# Patient Record
Sex: Female | Born: 1955 | Race: White | Hispanic: No | Marital: Single | Smoking: Heavy tobacco smoker
Health system: Southern US, Community
[De-identification: ages and names within clinical notes are randomized; demographics above are authoritative.]

## PROBLEM LIST (undated history)

## (undated) DIAGNOSIS — J449 Chronic obstructive pulmonary disease, unspecified: Secondary | ICD-10-CM

## (undated) DIAGNOSIS — I10 Essential (primary) hypertension: Secondary | ICD-10-CM

## (undated) DIAGNOSIS — D472 Monoclonal gammopathy: Secondary | ICD-10-CM

## (undated) DIAGNOSIS — J45909 Unspecified asthma, uncomplicated: Secondary | ICD-10-CM

## (undated) HISTORY — PX: OTHER SURGICAL HISTORY: SHX169

## (undated) HISTORY — DX: Chronic obstructive pulmonary disease, unspecified: J44.9

## (undated) HISTORY — PX: BONE MARROW BIOPSY: SHX199

## (undated) HISTORY — PX: TYMPANOSTOMY TUBE PLACEMENT: SHX32

## (undated) HISTORY — DX: Unspecified asthma, uncomplicated: J45.909

## (undated) HISTORY — PX: ADENOIDECTOMY: SUR15

## (undated) HISTORY — DX: Essential (primary) hypertension: I10

## (undated) HISTORY — PX: BREAST BIOPSY: SHX20

---

## 2002-12-02 ENCOUNTER — Other Ambulatory Visit: Admission: RE | Admit: 2002-12-02 | Discharge: 2002-12-02 | Payer: Self-pay | Admitting: Obstetrics and Gynecology

## 2006-02-24 ENCOUNTER — Encounter: Admission: RE | Admit: 2006-02-24 | Discharge: 2006-02-24 | Payer: Self-pay | Admitting: Internal Medicine

## 2006-10-19 ENCOUNTER — Ambulatory Visit: Payer: Self-pay | Admitting: Internal Medicine

## 2006-10-28 LAB — CBC WITH DIFFERENTIAL/PLATELET
Basophils Absolute: 0.1 10*3/uL (ref 0.0–0.1)
Eosinophils Absolute: 0.1 10*3/uL (ref 0.0–0.5)
HCT: 42.5 % (ref 34.8–46.6)
HGB: 14.3 g/dL (ref 11.6–15.9)
NEUT#: 4.2 10*3/uL (ref 1.5–6.5)
RDW: 13.9 % (ref 11.3–14.5)
lymph#: 1.5 10*3/uL (ref 0.9–3.3)

## 2006-10-28 LAB — ERYTHROCYTE SEDIMENTATION RATE: Sed Rate: 16 mm/hr (ref 0–30)

## 2006-11-02 LAB — COMPREHENSIVE METABOLIC PANEL
ALT: 24 U/L (ref 0–40)
AST: 18 U/L (ref 0–37)
Albumin: 5 g/dL (ref 3.5–5.2)
Alkaline Phosphatase: 64 U/L (ref 39–117)
BUN: 14 mg/dL (ref 6–23)
CO2: 28 mEq/L (ref 19–32)
Calcium: 9.9 mg/dL (ref 8.4–10.5)
Chloride: 101 mEq/L (ref 96–112)
Creatinine, Ser: 0.74 mg/dL (ref 0.40–1.20)
Glucose, Bld: 83 mg/dL (ref 70–99)
Potassium: 4.1 mEq/L (ref 3.5–5.3)
Sodium: 138 mEq/L (ref 135–145)
Total Bilirubin: 0.4 mg/dL (ref 0.3–1.2)
Total Protein: 7.6 g/dL (ref 6.0–8.3)

## 2006-11-02 LAB — IRON AND TIBC
%SAT: 20 % (ref 20–55)
Iron: 79 ug/dL (ref 42–145)
TIBC: 386 ug/dL (ref 250–470)
UIBC: 307 ug/dL

## 2006-11-02 LAB — LACTATE DEHYDROGENASE: LDH: 164 U/L (ref 94–250)

## 2006-11-02 LAB — KAPPA/LAMBDA LIGHT CHAINS: Kappa free light chain: 1.25 mg/dL (ref 0.33–1.94)

## 2006-11-02 LAB — IMMUNOFIXATION ELECTROPHORESIS
IgM, Serum: 225 mg/dL (ref 60–263)
Total Protein, Serum Electrophoresis: 7.6 g/dL (ref 6.0–8.3)

## 2006-11-02 LAB — BETA 2 MICROGLOBULIN, SERUM: Beta-2 Microglobulin: 1.17 mg/L (ref 1.01–1.73)

## 2006-11-03 ENCOUNTER — Ambulatory Visit (HOSPITAL_COMMUNITY): Admission: RE | Admit: 2006-11-03 | Discharge: 2006-11-03 | Payer: Self-pay | Admitting: Internal Medicine

## 2006-11-11 ENCOUNTER — Other Ambulatory Visit: Admission: RE | Admit: 2006-11-11 | Discharge: 2006-11-11 | Payer: Self-pay | Admitting: Obstetrics and Gynecology

## 2006-11-16 LAB — CBC WITH DIFFERENTIAL/PLATELET
Basophils Absolute: 0 10*3/uL (ref 0.0–0.1)
Eosinophils Absolute: 0.1 10*3/uL (ref 0.0–0.5)
HGB: 12.9 g/dL (ref 11.6–15.9)
LYMPH%: 17 % (ref 14.0–48.0)
MONO#: 0.3 10*3/uL (ref 0.1–0.9)
NEUT#: 5.2 10*3/uL (ref 1.5–6.5)
Platelets: 156 10*3/uL (ref 145–400)
RBC: 4.02 10*6/uL (ref 3.70–5.32)
WBC: 6.8 10*3/uL (ref 3.9–10.0)

## 2006-11-16 LAB — LACTATE DEHYDROGENASE: LDH: 145 U/L (ref 94–250)

## 2006-11-27 ENCOUNTER — Encounter (INDEPENDENT_AMBULATORY_CARE_PROVIDER_SITE_OTHER): Payer: Self-pay | Admitting: Specialist

## 2006-11-27 ENCOUNTER — Ambulatory Visit: Payer: Self-pay | Admitting: Internal Medicine

## 2006-11-27 ENCOUNTER — Ambulatory Visit (HOSPITAL_COMMUNITY): Admission: RE | Admit: 2006-11-27 | Discharge: 2006-11-27 | Payer: Self-pay | Admitting: Internal Medicine

## 2006-12-08 ENCOUNTER — Ambulatory Visit: Payer: Self-pay | Admitting: Internal Medicine

## 2006-12-10 LAB — CBC WITH DIFFERENTIAL/PLATELET
Basophils Absolute: 0 10*3/uL (ref 0.0–0.1)
EOS%: 1 % (ref 0.0–7.0)
Eosinophils Absolute: 0.1 10*3/uL (ref 0.0–0.5)
HCT: 38 % (ref 34.8–46.6)
HGB: 12.9 g/dL (ref 11.6–15.9)
LYMPH%: 19.9 % (ref 14.0–48.0)
MCH: 32.1 pg (ref 26.0–34.0)
MCV: 94.6 fL (ref 81.0–101.0)
MONO%: 4.8 % (ref 0.0–13.0)
NEUT#: 4.5 10*3/uL (ref 1.5–6.5)
NEUT%: 73.7 % (ref 39.6–76.8)
Platelets: 173 10*3/uL (ref 145–400)

## 2006-12-10 LAB — COMPREHENSIVE METABOLIC PANEL
AST: 15 U/L (ref 0–37)
Albumin: 4.6 g/dL (ref 3.5–5.2)
Alkaline Phosphatase: 57 U/L (ref 39–117)
BUN: 11 mg/dL (ref 6–23)
Creatinine, Ser: 0.78 mg/dL (ref 0.40–1.20)
Glucose, Bld: 126 mg/dL — ABNORMAL HIGH (ref 70–99)
Potassium: 3.9 mEq/L (ref 3.5–5.3)
Total Bilirubin: 0.3 mg/dL (ref 0.3–1.2)

## 2007-03-01 ENCOUNTER — Ambulatory Visit: Payer: Self-pay | Admitting: Internal Medicine

## 2007-03-04 LAB — CBC WITH DIFFERENTIAL/PLATELET
BASO%: 0.6 % (ref 0.0–2.0)
HCT: 40 % (ref 34.8–46.6)
LYMPH%: 19.9 % (ref 14.0–48.0)
MCHC: 34.4 g/dL (ref 32.0–36.0)
MCV: 93.3 fL (ref 81.0–101.0)
MONO#: 0.5 10*3/uL (ref 0.1–0.9)
MONO%: 6.1 % (ref 0.0–13.0)
NEUT%: 71.9 % (ref 39.6–76.8)
Platelets: 238 10*3/uL (ref 145–400)
RBC: 4.29 10*6/uL (ref 3.70–5.32)
WBC: 8 10*3/uL (ref 3.9–10.0)

## 2007-03-05 LAB — COMPREHENSIVE METABOLIC PANEL
ALT: 13 U/L (ref 0–35)
Alkaline Phosphatase: 59 U/L (ref 39–117)
CO2: 24 mEq/L (ref 19–32)
Creatinine, Ser: 0.75 mg/dL (ref 0.40–1.20)
Glucose, Bld: 92 mg/dL (ref 70–99)
Total Bilirubin: 0.3 mg/dL (ref 0.3–1.2)

## 2007-03-05 LAB — LACTATE DEHYDROGENASE: LDH: 175 U/L (ref 94–250)

## 2007-03-05 LAB — IGG, IGA, IGM
IgG (Immunoglobin G), Serum: 899 mg/dL (ref 694–1618)
IgM, Serum: 213 mg/dL (ref 60–263)

## 2007-03-11 ENCOUNTER — Ambulatory Visit (HOSPITAL_COMMUNITY): Admission: RE | Admit: 2007-03-11 | Discharge: 2007-03-11 | Payer: Self-pay | Admitting: Internal Medicine

## 2007-06-01 ENCOUNTER — Ambulatory Visit: Payer: Self-pay | Admitting: Internal Medicine

## 2007-06-03 LAB — CBC WITH DIFFERENTIAL/PLATELET
BASO%: 1.5 % (ref 0.0–2.0)
EOS%: 0 % (ref 0.0–7.0)
Eosinophils Absolute: 0 10*3/uL (ref 0.0–0.5)
LYMPH%: 23.4 % (ref 14.0–48.0)
MCHC: 34.9 g/dL (ref 32.0–36.0)
MCV: 92.8 fL (ref 81.0–101.0)
MONO%: 9 % (ref 0.0–13.0)
NEUT#: 3.3 10*3/uL (ref 1.5–6.5)
Platelets: 175 10*3/uL (ref 145–400)
RBC: 4.12 10*6/uL (ref 3.70–5.32)
RDW: 13.9 % (ref 11.3–14.5)

## 2007-06-04 LAB — COMPREHENSIVE METABOLIC PANEL
ALT: 30 U/L (ref 0–35)
AST: 26 U/L (ref 0–37)
Albumin: 4.4 g/dL (ref 3.5–5.2)
Alkaline Phosphatase: 100 U/L (ref 39–117)
Glucose, Bld: 92 mg/dL (ref 70–99)
Potassium: 4 mEq/L (ref 3.5–5.3)
Sodium: 142 mEq/L (ref 135–145)
Total Bilirubin: 0.4 mg/dL (ref 0.3–1.2)
Total Protein: 7.1 g/dL (ref 6.0–8.3)

## 2007-06-04 LAB — BETA 2 MICROGLOBULIN, SERUM: Beta-2 Microglobulin: 1.64 mg/L (ref 1.01–1.73)

## 2007-06-04 LAB — IGG, IGA, IGM: IgG (Immunoglobin G), Serum: 713 mg/dL (ref 694–1618)

## 2007-10-01 ENCOUNTER — Ambulatory Visit: Payer: Self-pay | Admitting: Internal Medicine

## 2007-10-05 LAB — CBC WITH DIFFERENTIAL/PLATELET
Basophils Absolute: 0 10*3/uL (ref 0.0–0.1)
EOS%: 2.1 % (ref 0.0–7.0)
HGB: 12.1 g/dL (ref 11.6–15.9)
LYMPH%: 22.8 % (ref 14.0–48.0)
MCH: 32.2 pg (ref 26.0–34.0)
MCV: 93.8 fL (ref 81.0–101.0)
MONO%: 5.8 % (ref 0.0–13.0)
Platelets: 145 10*3/uL (ref 145–400)
RDW: 13.1 % (ref 11.3–14.5)

## 2007-10-06 LAB — COMPREHENSIVE METABOLIC PANEL
AST: 16 U/L (ref 0–37)
Albumin: 4.3 g/dL (ref 3.5–5.2)
Alkaline Phosphatase: 48 U/L (ref 39–117)
BUN: 17 mg/dL (ref 6–23)
Creatinine, Ser: 0.82 mg/dL (ref 0.40–1.20)
Potassium: 4.4 mEq/L (ref 3.5–5.3)
Total Bilirubin: 0.3 mg/dL (ref 0.3–1.2)

## 2007-10-06 LAB — KAPPA/LAMBDA LIGHT CHAINS
Kappa free light chain: 1.02 mg/dL (ref 0.33–1.94)
Lambda Free Lght Chn: 0.99 mg/dL (ref 0.57–2.63)

## 2007-10-06 LAB — IGG, IGA, IGM
IgA: 177 mg/dL (ref 68–378)
IgG (Immunoglobin G), Serum: 786 mg/dL (ref 694–1618)
IgM, Serum: 210 mg/dL (ref 60–263)

## 2008-03-31 ENCOUNTER — Ambulatory Visit: Payer: Self-pay | Admitting: Internal Medicine

## 2008-04-04 LAB — CBC WITH DIFFERENTIAL/PLATELET
Basophils Absolute: 0 10*3/uL (ref 0.0–0.1)
EOS%: 0 % (ref 0.0–7.0)
Eosinophils Absolute: 0 10*3/uL (ref 0.0–0.5)
HCT: 40.8 % (ref 34.8–46.6)
HGB: 14.1 g/dL (ref 11.6–15.9)
MCH: 32.1 pg (ref 26.0–34.0)
MCV: 93.2 fL (ref 81.0–101.0)
MONO%: 4.1 % (ref 0.0–13.0)
NEUT#: 4.7 10*3/uL (ref 1.5–6.5)
NEUT%: 76 % (ref 39.6–76.8)
lymph#: 1.2 10*3/uL (ref 0.9–3.3)

## 2008-04-05 LAB — COMPREHENSIVE METABOLIC PANEL
AST: 14 U/L (ref 0–37)
Albumin: 4.5 g/dL (ref 3.5–5.2)
BUN: 15 mg/dL (ref 6–23)
Calcium: 9.5 mg/dL (ref 8.4–10.5)
Chloride: 104 mEq/L (ref 96–112)
Creatinine, Ser: 0.79 mg/dL (ref 0.40–1.20)
Glucose, Bld: 91 mg/dL (ref 70–99)
Potassium: 4.3 mEq/L (ref 3.5–5.3)

## 2008-04-05 LAB — BETA 2 MICROGLOBULIN, SERUM: Beta-2 Microglobulin: 1.3 mg/L (ref 1.01–1.73)

## 2008-04-05 LAB — IGG, IGA, IGM
IgA: 177 mg/dL (ref 68–378)
IgG (Immunoglobin G), Serum: 860 mg/dL (ref 694–1618)

## 2008-04-05 LAB — KAPPA/LAMBDA LIGHT CHAINS: Lambda Free Lght Chn: 1.28 mg/dL (ref 0.57–2.63)

## 2008-10-06 ENCOUNTER — Ambulatory Visit: Payer: Self-pay | Admitting: Internal Medicine

## 2009-10-08 ENCOUNTER — Encounter: Admission: RE | Admit: 2009-10-08 | Discharge: 2009-10-08 | Payer: Self-pay | Admitting: Internal Medicine

## 2013-04-07 ENCOUNTER — Emergency Department (INDEPENDENT_AMBULATORY_CARE_PROVIDER_SITE_OTHER)
Admission: EM | Admit: 2013-04-07 | Discharge: 2013-04-07 | Disposition: A | Discharge status: Home / Self Care | Payer: No Typology Code available for payment source | Source: Home / Self Care | Attending: Family Medicine | Admitting: Family Medicine

## 2013-04-07 ENCOUNTER — Encounter (HOSPITAL_COMMUNITY): Payer: Self-pay

## 2013-04-07 DIAGNOSIS — H00019 Hordeolum externum unspecified eye, unspecified eyelid: Secondary | ICD-10-CM

## 2013-04-07 DIAGNOSIS — H00016 Hordeolum externum left eye, unspecified eyelid: Secondary | ICD-10-CM

## 2013-04-07 DIAGNOSIS — K029 Dental caries, unspecified: Secondary | ICD-10-CM | POA: Diagnosis present

## 2013-04-07 DIAGNOSIS — D472 Monoclonal gammopathy: Secondary | ICD-10-CM

## 2013-04-07 HISTORY — DX: Monoclonal gammopathy: D47.2

## 2013-04-07 MED ORDER — DOXYCYCLINE HYCLATE 100 MG PO TABS: 100.0000 mg | ORAL_TABLET | Freq: Two times a day (BID) | ORAL | Status: DC

## 2013-04-07 NOTE — ED Provider Notes (Signed)
History     CSN: [id number]  Arrival date & time 04/07/13  1533   First MD Initiated Contact with Patient 04/07/13 1605      Chief Complaint  Patient presents with  . Dental Problem    HPI Pt says that she has had a dental problem for several weeks and not able to get to see a dentist because of not having medical insurance.  No fever or chills.  She has a stye on left eye.    Past Medical History  Diagnosis Date  . MGUS (monoclonal gammopathy of unknown significance)     History reviewed. No pertinent past surgical history.  No family history on file.  History  Substance Use Topics  . Smoking status: Light Tobacco Smoker  . Smokeless tobacco: Not on file  . Alcohol Use: Yes    OB History   Grav Para Term Preterm Abortions TAB SAB Ect Mult Living                 Review of Systems Constitutional: Negative.  HENT: dental problems.  Respiratory: Negative.  Cardiovascular: Negative.  Gastrointestinal: Negative.  Endocrine: Negative.  Genitourinary: Negative.  Musculoskeletal: Negative.  Skin: Negative.  Allergic/Immunologic: Negative.  Neurological: Negative.  Hematological: Negative.  Psychiatric/Behavioral: Negative.  All other systems reviewed and are negative   Allergies  Codeine  Home Medications   Current Outpatient Rx  Name  Route  Sig  Dispense  Refill  . loratadine (CLARITIN) 10 MG tablet   Oral   Take 10 mg by mouth daily.           BP 109/41  Pulse 95  Temp(Src) 98.1 F (36.7 C) (Oral)  Resp 18  SpO2 98%  Physical Exam Nursing note and vitals reviewed.  Constitutional: She is oriented to person, place, and time. She appears well-developed and well-nourished. No distress.  HENT: severe dental caries and swollen gums  Head: Normocephalic and atraumatic.  Eyes: small stye left eye.  Conjunctivae and EOM are normal. Pupils are equal, round, and reactive to light.  Neck: Normal range of motion. Neck supple. No JVD present. No  tracheal deviation present. No thyromegaly present.  Cardiovascular: Normal rate, regular rhythm and normal heart sounds.  Pulmonary/Chest: Effort normal and breath sounds normal. No respiratory distress. She has no wheezes.  Abdominal: Soft. Bowel sounds are normal.  Musculoskeletal: Normal range of motion. She exhibits no edema and no tenderness.  Lymphadenopathy:  She has no cervical adenopathy.  Neurological: She is alert and oriented to person, place, and time. She has normal reflexes.  Skin: Skin is warm and dry.  Psychiatric: She has a normal mood and affect. Her behavior is normal. Judgment and thought content normal.   ED Course  Procedures (including critical care time)  Labs Reviewed - No data to display No results found.   No diagnosis found.  MDM  IMPRESSION  Dental Caries   MGUS  Stye left eye   RECOMMENDATIONS / PLAN Refer to dentist for further evaluation Doxycycline 100 mg po bid  FOLLOW UP 4 months   The patient was given clear instructions to go to ER or return to medical center if symptoms don't improve, worsen or new problems develop.  The patient verbalized understanding.  The patient was told to call to get lab results if they haven't heard anything in the next week.            Cleora Fleet, MD 04/07/13 [phone]

## 2013-04-07 NOTE — ED Notes (Signed)
Patient states needs a referral to dentist Also has a sty to top of left eye Needs allergy medication refill

## 2013-04-26 ENCOUNTER — Encounter (HOSPITAL_COMMUNITY): Payer: Self-pay | Admitting: Nurse Practitioner

## 2013-04-26 ENCOUNTER — Emergency Department (HOSPITAL_COMMUNITY)
Admission: EM | Admit: 2013-04-26 | Discharge: 2013-04-26 | Disposition: A | Discharge status: Nursing Home (Includes ICF) | Payer: No Typology Code available for payment source | Source: Home / Self Care

## 2013-04-26 ENCOUNTER — Encounter (HOSPITAL_COMMUNITY): Payer: Self-pay

## 2013-04-26 ENCOUNTER — Emergency Department (HOSPITAL_COMMUNITY)
Admission: EM | Admit: 2013-04-26 | Discharge: 2013-04-26 | Disposition: A | Discharge status: Home / Self Care | Payer: No Typology Code available for payment source | Attending: Emergency Medicine | Admitting: Emergency Medicine

## 2013-04-26 DIAGNOSIS — K029 Dental caries, unspecified: Secondary | ICD-10-CM | POA: Insufficient documentation

## 2013-04-26 DIAGNOSIS — R220 Localized swelling, mass and lump, head: Secondary | ICD-10-CM | POA: Insufficient documentation

## 2013-04-26 DIAGNOSIS — K047 Periapical abscess without sinus: Secondary | ICD-10-CM | POA: Insufficient documentation

## 2013-04-26 DIAGNOSIS — F17200 Nicotine dependence, unspecified, uncomplicated: Secondary | ICD-10-CM | POA: Insufficient documentation

## 2013-04-26 DIAGNOSIS — Z8742 Personal history of other diseases of the female genital tract: Secondary | ICD-10-CM | POA: Insufficient documentation

## 2013-04-26 MED ORDER — AMOXICILLIN 500 MG PO CAPS: 1000.0000 mg | ORAL_CAPSULE | Freq: Two times a day (BID) | ORAL | Status: DC

## 2013-04-26 MED ORDER — HYDROCODONE-ACETAMINOPHEN 5-325 MG PO TABS: 1.0000 | ORAL_TABLET | ORAL | Status: DC | PRN

## 2013-04-26 MED ORDER — AMOXICILLIN 500 MG PO CAPS
1000.0000 mg | ORAL_CAPSULE | Freq: Once | ORAL | Status: AC
  Administered 2013-04-26: 1000 mg via ORAL
  Filled 2013-04-26: qty 2

## 2013-04-26 NOTE — ED Provider Notes (Signed)
Medical screening examination/treatment/procedure(s) were performed by non-physician practitioner and as supervising physician I was immediately available for consultation/collaboration.   Dione Booze, MD 04/26/13 2153

## 2013-04-26 NOTE — ED Provider Notes (Signed)
History    This chart was scribed for non-physician practitioner Elpidio Anis, PA-C working with Dione Booze, MD by Gerlean Ren, ED Scribe. This patient was seen in room TR10C/TR10C and the patient's care was started at 7:15 PM.   CSN: [id number]  Arrival date & time 04/26/13  1806   First MD Initiated Contact with Patient 04/26/13 1839      Chief Complaint  Patient presents with  . Dental Pain     The history is provided by the patient. No language interpreter was used.  Debbie Black is a 57 y.o. female who presents to the Emergency Department complaining of right side dental pain with associated swelling that began today and is gradually worsening.  Pt has had similar pain and swelling on the right side about one month ago for which pt took doxycycline from Urgent Care with improvements to symptoms.  Pt was told that dentist would call her but was never contacted by them.   Pt reports dental pain has been previously controlled by tylenol and ibuprofen but requests something stronger because current pain is more severe.  Pt denies associated fever.     Past Medical History  Diagnosis Date  . MGUS (monoclonal gammopathy of unknown significance)     History reviewed. No pertinent past surgical history.  History reviewed. No pertinent family history.  History  Substance Use Topics  . Smoking status: Light Tobacco Smoker  . Smokeless tobacco: Not on file  . Alcohol Use: Yes    No OB history provided.   Review of Systems  Constitutional: Negative for fever.  HENT: Positive for facial swelling and dental problem.     Allergies  Adhesive; Codeine; and Latex  Home Medications   Current Outpatient Rx  Name  Route  Sig  Dispense  Refill  . Ibuprofen (IBU PO)   Oral   Take 3 tablets by mouth once.           BP 134/53  Pulse 92  Temp(Src) 98.9 F (37.2 C) (Oral)  Resp 16  SpO2 97%  Physical Exam  Nursing note and vitals reviewed. Constitutional: She is oriented  to person, place, and time. She appears well-developed and well-nourished. No distress.  HENT:  Head: Normocephalic and atraumatic.  Generally poor dentition with widespread decay and multiple caries. Right sided facial swelling over mid-mandible.   Eyes: EOM are normal.  Neck: Neck supple. No tracheal deviation present.  Cardiovascular: Normal rate.   Pulmonary/Chest: Effort normal. No respiratory distress.  Musculoskeletal: Normal range of motion.  Neurological: She is alert and oriented to person, place, and time.  Skin: Skin is warm and dry.  Psychiatric: She has a normal mood and affect. Her behavior is normal.    ED Course  Procedures (including critical care time) DIAGNOSTIC STUDIES: Oxygen Saturation is 97% on room air, adequate by my interpretation.    COORDINATION OF CARE: 7:18 PM- Discussed pain medicine and antibiotics.  Advised pt to use warm compresses.  Informed pt that further follow-up with dentist will be necessary and that the waitlist at the dentist previously mentioned may be long.  Pt verbalizes understanding and agrees with plan.     No diagnosis found.   1. Dental abscess. MDM  Uncomplicated dental abscess      I personally performed the services described in this documentation, which was scribed in my presence. The recorded information has been reviewed and is accurate.     Arnoldo Hooker, PA-C 04/26/13 1925

## 2013-04-26 NOTE — ED Notes (Signed)
[facility] transfer for R sided toothache/swelling, possible infection. Pt is awaiting appt at dental clinic for same but pain was worse today

## 2013-04-26 NOTE — ED Notes (Signed)
Patient states has toothache to bottom right side Face is swollen and hot to the touch Referral to [facility] faxed out again to get an appt ASAP

## 2013-04-27 NOTE — ED Notes (Signed)
Patient was seen in the ED for a dental abscess Was referred to Dr drewry m vincent [address] [phone]

## 2014-09-29 ENCOUNTER — Other Ambulatory Visit (HOSPITAL_COMMUNITY): Payer: Self-pay | Admitting: Endocrinology

## 2014-09-29 DIAGNOSIS — E0590 Thyrotoxicosis, unspecified without thyrotoxic crisis or storm: Secondary | ICD-10-CM

## 2014-10-09 ENCOUNTER — Encounter (HOSPITAL_COMMUNITY): Payer: Self-pay

## 2014-10-10 ENCOUNTER — Encounter (HOSPITAL_COMMUNITY): Payer: No Typology Code available for payment source

## 2014-10-11 ENCOUNTER — Encounter (HOSPITAL_COMMUNITY)
Admission: RE | Admit: 2014-10-11 | Discharge: 2014-10-11 | Disposition: A | Discharge status: Home / Self Care | Payer: No Typology Code available for payment source | Source: Ambulatory Visit | Attending: Endocrinology | Admitting: Endocrinology

## 2014-10-11 DIAGNOSIS — E0590 Thyrotoxicosis, unspecified without thyrotoxic crisis or storm: Secondary | ICD-10-CM | POA: Insufficient documentation

## 2014-10-12 ENCOUNTER — Encounter (HOSPITAL_COMMUNITY)
Admission: RE | Admit: 2014-10-12 | Discharge: 2014-10-12 | Disposition: A | Discharge status: Home / Self Care | Payer: No Typology Code available for payment source | Source: Ambulatory Visit | Attending: Endocrinology | Admitting: Endocrinology

## 2014-10-12 DIAGNOSIS — E0590 Thyrotoxicosis, unspecified without thyrotoxic crisis or storm: Secondary | ICD-10-CM | POA: Diagnosis not present

## 2014-10-12 MED ORDER — SODIUM IODIDE I 131 CAPSULE
12.0000 | Freq: Once | INTRAVENOUS | Status: AC | PRN
  Administered 2014-10-12: 12 via ORAL

## 2014-10-12 MED ORDER — SODIUM PERTECHNETATE TC 99M INJECTION
11.0000 | Freq: Once | INTRAVENOUS | Status: AC | PRN
  Administered 2014-10-12: 11 via INTRAVENOUS

## 2014-11-01 ENCOUNTER — Other Ambulatory Visit (HOSPITAL_COMMUNITY): Payer: Self-pay | Admitting: Endocrinology

## 2014-11-01 DIAGNOSIS — E0590 Thyrotoxicosis, unspecified without thyrotoxic crisis or storm: Secondary | ICD-10-CM

## 2014-11-15 ENCOUNTER — Encounter (HOSPITAL_COMMUNITY)
Admission: RE | Admit: 2014-11-15 | Discharge: 2014-11-15 | Disposition: A | Discharge status: Home / Self Care | Payer: Self-pay | Source: Ambulatory Visit | Attending: Endocrinology | Admitting: Endocrinology

## 2015-11-03 ENCOUNTER — Other Ambulatory Visit: Payer: Self-pay | Admitting: Internal Medicine

## 2015-11-03 DIAGNOSIS — Z1231 Encounter for screening mammogram for malignant neoplasm of breast: Secondary | ICD-10-CM

## 2015-12-18 ENCOUNTER — Ambulatory Visit
Admission: RE | Admit: 2015-12-18 | Discharge: 2015-12-18 | Disposition: A | Discharge status: Home / Self Care | Payer: No Typology Code available for payment source | Source: Ambulatory Visit | Attending: Internal Medicine | Admitting: Internal Medicine

## 2015-12-18 DIAGNOSIS — Z1231 Encounter for screening mammogram for malignant neoplasm of breast: Secondary | ICD-10-CM

## 2015-12-21 ENCOUNTER — Encounter: Payer: Self-pay | Admitting: Internal Medicine

## 2015-12-21 ENCOUNTER — Other Ambulatory Visit: Payer: Self-pay | Admitting: Internal Medicine

## 2015-12-21 DIAGNOSIS — R928 Other abnormal and inconclusive findings on diagnostic imaging of breast: Secondary | ICD-10-CM

## 2015-12-27 ENCOUNTER — Other Ambulatory Visit: Payer: Self-pay | Admitting: Internal Medicine

## 2015-12-27 ENCOUNTER — Ambulatory Visit
Admission: RE | Admit: 2015-12-27 | Discharge: 2015-12-27 | Disposition: A | Discharge status: Home / Self Care | Payer: No Typology Code available for payment source | Source: Ambulatory Visit | Attending: Internal Medicine | Admitting: Internal Medicine

## 2015-12-27 DIAGNOSIS — R928 Other abnormal and inconclusive findings on diagnostic imaging of breast: Secondary | ICD-10-CM

## 2016-01-02 ENCOUNTER — Other Ambulatory Visit: Payer: Self-pay | Admitting: Internal Medicine

## 2016-01-02 ENCOUNTER — Ambulatory Visit
Admission: RE | Admit: 2016-01-02 | Discharge: 2016-01-02 | Disposition: A | Discharge status: Home / Self Care | Payer: No Typology Code available for payment source | Source: Ambulatory Visit | Attending: Internal Medicine | Admitting: Internal Medicine

## 2016-01-02 DIAGNOSIS — R928 Other abnormal and inconclusive findings on diagnostic imaging of breast: Secondary | ICD-10-CM

## 2016-01-02 HISTORY — PX: BREAST BIOPSY: SHX20

## 2019-10-07 ENCOUNTER — Ambulatory Visit (INDEPENDENT_AMBULATORY_CARE_PROVIDER_SITE_OTHER): Payer: No Typology Code available for payment source

## 2019-10-07 ENCOUNTER — Other Ambulatory Visit: Payer: Self-pay

## 2019-10-07 ENCOUNTER — Encounter (HOSPITAL_COMMUNITY): Payer: Self-pay | Admitting: Emergency Medicine

## 2019-10-07 ENCOUNTER — Ambulatory Visit (HOSPITAL_COMMUNITY)
Admission: EM | Admit: 2019-10-07 | Discharge: 2019-10-07 | Disposition: A | Discharge status: Home / Self Care | Payer: No Typology Code available for payment source | Attending: Emergency Medicine | Admitting: Emergency Medicine

## 2019-10-07 DIAGNOSIS — M25551 Pain in right hip: Secondary | ICD-10-CM

## 2019-10-07 DIAGNOSIS — S42292A Other displaced fracture of upper end of left humerus, initial encounter for closed fracture: Secondary | ICD-10-CM

## 2019-10-07 MED ORDER — IBUPROFEN 800 MG PO TABS
800.0000 mg | ORAL_TABLET | Freq: Once | ORAL | Status: AC
  Administered 2019-10-07: 20:00:00 800 mg via ORAL

## 2019-10-07 MED ORDER — FLUTICASONE PROPIONATE 50 MCG/ACT NA SUSP: 1.0000 | Freq: Every day | NASAL | 0 refills | Status: DC

## 2019-10-07 MED ORDER — IBUPROFEN 800 MG PO TABS
ORAL_TABLET | ORAL | Status: AC
  Filled 2019-10-07: qty 1

## 2019-10-07 MED ORDER — TRAMADOL HCL 50 MG PO TABS: 50.0000 mg | ORAL_TABLET | Freq: Four times a day (QID) | ORAL | 0 refills | Status: DC | PRN

## 2019-10-07 NOTE — ED Provider Notes (Signed)
[place] [facility]    CSN: [id number] Arrival date & time: 10/07/19  1722      History   Chief Complaint Chief Complaint  Patient presents with  . Fall  . Shoulder Pain    HPI Debbie Black is a 63 y.o. female history of osteoporosis, presenting today for evaluation of left shoulder pain and right hip pain after a fall.  Patient was walking yesterday, tripped and fell forward landing on her right side and then rolled onto her left causing her left shoulder pain.  Since she has had decreased ability to move her left shoulder as well as bruising that is extending down the upper arm.  Pain at arm 8 out of 10.  Denies previous injury to this shoulder.  She also endorses some right hip pain, points to left lower back area.  Rates pain at hips 6 out of 10.  Denies issues with control of urination or bowel movements.  Denies use of blood thinners.  Denies hitting head or loss of consciousness.  Denies dizziness, lightheadedness or vision changes.  Denies difficulty speaking.  HPI  Past Medical History:  Diagnosis Date  . MGUS (monoclonal gammopathy of unknown significance)     Patient Active Problem List   Diagnosis Date Noted  . MGUS (monoclonal gammopathy of unknown significance) 04/07/2013  . Dental caries 04/07/2013  . Stye 04/07/2013    History reviewed. No pertinent surgical history.  OB History   No obstetric history on file.      Home Medications    Prior to Admission medications   Medication Sig Start Date End Date Taking? Authorizing Provider  fluticasone (FLONASE) 50 MCG/ACT nasal spray Place 1-2 sprays into both nostrils daily for 7 days. 10/07/19 10/14/19  Gwyn Mehring C, PA-C  Ibuprofen (IBU PO) Take 3 tablets by mouth once.    [provider]  traMADol (ULTRAM) 50 MG tablet Take 1 tablet (50 mg total) by mouth every 6 (six) hours as needed for severe pain. 10/07/19   Beila Purdie, Elesa Hacker, PA-C    Family History No family history on file.   Social History Social History   Tobacco Use  . Smoking status: Light Tobacco Smoker  Substance Use Topics  . Alcohol use: Yes  . Drug use: Not on file     Allergies   Adhesive [tape], Codeine, and Latex   Review of Systems Review of Systems  Constitutional: Negative for activity change, chills, diaphoresis and fatigue.  HENT: Negative for ear pain, tinnitus and trouble swallowing.   Eyes: Negative for photophobia and visual disturbance.  Respiratory: Negative for cough, chest tightness and shortness of breath.   Cardiovascular: Negative for chest pain and leg swelling.  Gastrointestinal: Negative for abdominal pain, blood in stool, nausea and vomiting.  Musculoskeletal: Positive for arthralgias, back pain and myalgias. Negative for gait problem, neck pain and neck stiffness.  Skin: Positive for color change. Negative for wound.  Neurological: Negative for dizziness, weakness, light-headedness, numbness and headaches.     Physical Exam Triage Vital Signs ED Triage Vitals  Enc Vitals Group     BP 10/07/19 1735 (!) 168/86     Pulse Rate 10/07/19 1735 (!) 104     Resp 10/07/19 1735 16     Temp 10/07/19 1735 98.1 F (36.7 C)     Temp Source 10/07/19 1735 Temporal     SpO2 10/07/19 1735 99 %     Weight --      Height --  Head Circumference --      Peak Flow --      Pain Score 10/07/19 1743 9     Pain Loc --      Pain Edu? --      Excl. in [place]? --    No data found.  Updated Vital Signs BP (!) 168/86 (BP Location: Left Arm)   Pulse (!) 104   Temp 98.1 F (36.7 C) (Temporal)   Resp 16   SpO2 99%   Visual Acuity Right Eye Distance:   Left Eye Distance:   Bilateral Distance:    Right Eye Near:   Left Eye Near:    Bilateral Near:     Physical Exam Vitals signs and nursing note reviewed.  Constitutional:      General: She is not in acute distress.    Appearance: She is well-developed.  HENT:     Head: Normocephalic and atraumatic.  Eyes:      Extraocular Movements: Extraocular movements intact.     Conjunctiva/sclera: Conjunctivae normal.     Pupils: Pupils are equal, round, and reactive to light.  Neck:     Musculoskeletal: Neck supple.  Cardiovascular:     Rate and Rhythm: Normal rate and regular rhythm.     Heart sounds: No murmur.  Pulmonary:     Effort: Pulmonary effort is normal. No respiratory distress.     Breath sounds: Normal breath sounds.  Abdominal:     Palpations: Abdomen is soft.     Tenderness: There is no abdominal tenderness.  Musculoskeletal:     Comments: Very limited range of motion of left shoulder, tender to palpation of distal clavicle over AC joint and extending into proximal arm, bruising noted diffusely over bicep area  Nontender over scapular spine  Nontender to palpation of cervical, thoracic and lumbar spine midline, tenderness over right lateral lower lumbar/sacral area, nontender over anterior bony prominences of hip and to right groin  Gait with minimal abnormality  Skin:    General: Skin is warm and dry.  Neurological:     General: No focal deficit present.     Mental Status: She is alert and oriented to person, place, and time.     Comments: Speech clear, face symmetric      UC Treatments / Results  Labs (all labs ordered are listed, but only abnormal results are displayed) Labs Reviewed - No data to display  EKG   Radiology Dg Pelvis 1-2 Views  Result Date: 10/07/2019 CLINICAL DATA:  Right low back and hip pain after fall yesterday. EXAM: PELVIS - 1-2 VIEW COMPARISON:  None. FINDINGS: The patient is rotated, limiting evaluation. There is no evidence of pelvic fracture or diastasis. No pelvic bone lesions are seen. The hip joint spaces are preserved. Soft tissues are unremarkable. IMPRESSION: 1. Limited study.  No acute osseous abnormality. Electronically Signed   By: Titus Dubin M.D.   On: 10/07/2019 18:41   Dg Shoulder Left  Result Date: 10/07/2019 CLINICAL DATA:   Left shoulder pain post fall. EXAM: LEFT SHOULDER - 2+ VIEW COMPARISON:  None. FINDINGS: There is an impacted comminuted fracture of the left humeral head and neck with associated inferior displacement of the humerus on the glenoid. Overlying soft tissue swelling. IMPRESSION: 1. Impacted comminuted fracture of the left humeral head and neck with associated inferior displacement of the humerus on the glenoid. 2. Overlying soft tissue swelling. Electronically Signed   By: Fidela Salisbury M.D.   On: 10/07/2019 18:39  Dg Hip Unilat With Pelvis 2-3 Views Left  Result Date: 10/07/2019 CLINICAL DATA:  Hip pain EXAM: DG HIP (WITH OR WITHOUT PELVIS) 2-3V LEFT COMPARISON:  None. FINDINGS: There is no evidence of hip fracture or dislocation. There is no evidence of arthropathy or other focal bone abnormality. IMPRESSION: Negative. Electronically Signed   By: Constance Holster M.D.   On: 10/07/2019 19:11   Dg Hip Unilat With Pelvis 2-3 Views Right  Result Date: 10/07/2019 CLINICAL DATA:  Hip pain EXAM: DG HIP (WITH OR WITHOUT PELVIS) 2-3V RIGHT COMPARISON:  None. FINDINGS: There is no evidence of hip fracture or dislocation. There is no evidence of arthropathy or other focal bone abnormality. IMPRESSION: Negative. Electronically Signed   By: Constance Holster M.D.   On: 10/07/2019 19:12    Procedures Procedures (including critical care time)  Medications Ordered in UC Medications  ibuprofen (ADVIL) tablet 800 mg (800 mg Oral Given 10/07/19 1951)  ibuprofen (ADVIL) 800 MG tablet (has no administration in time range)    Initial Impression / Assessment and Plan / UC Course  I have reviewed the triage vital signs and the nursing notes.  Pertinent labs & imaging results that were available during my care of the patient were reviewed by me and considered in my medical decision making (see chart for details).    Left shoulder with comminuted/displaced fracture of humerus.  Discussed with Dr. Doreatha Martin.   Shoulder immobilizer placed, will follow up with him in clinic next week.  Tylenol and ibuprofen for pain in the interim, provided tramadol for severe pain.  Patient states she cannot take hydrocodone.  Feel benefit greater than risk, registry checked.  Initial x-ray of pelvis asymmetric, film with rotation, suboptimal for evaluation.  Repeated bilateral hips in order to ensure no bony abnormality.  Repeat films clear, no acute bony abnormality  Discussed strict return precautions. Patient verbalized understanding and is agreeable with plan.   Final Clinical Impressions(s) / UC Diagnoses   Final diagnoses:  Right hip pain  Closed fracture of head of left humerus, initial encounter     Discharge Instructions     Please follow-up with Dr. Tama Headings office, call first thing Monday morning Please wear shoulder sling 24/7 including while sleeping and bathing  May use tramadol for severe pain, if having any adverse side effects please stop May use Tylenol 500 to 1000 mg every 4-6 hours Ibuprofen 400 mg every 8 hours    ED Prescriptions    Medication Sig Dispense Auth. Provider   traMADol (ULTRAM) 50 MG tablet Take 1 tablet (50 mg total) by mouth every 6 (six) hours as needed for severe pain. 15 tablet Merick Kelleher C, PA-C   fluticasone (FLONASE) 50 MCG/ACT nasal spray Place 1-2 sprays into both nostrils daily for 7 days. 1 g Jimmy Stipes, [place] C, PA-C     I have reviewed the PDMP during this encounter.   Janith Lima, [place] 10/07/19 2047

## 2019-10-07 NOTE — Discharge Instructions (Signed)
Please follow-up with Dr. Tama Headings office, call first thing Monday morning Please wear shoulder sling 24/7 including while sleeping and bathing  May use tramadol for severe pain, if having any adverse side effects please stop May use Tylenol 500 to 1000 mg every 4-6 hours Ibuprofen 400 mg every 8 hours

## 2019-10-07 NOTE — ED Triage Notes (Signed)
Triaged by provider  

## 2019-10-10 MED FILL — FLUTICASONE PROP 50 MCG SPR: 50 | 30 days supply | Qty: 16 | Fill #0

## 2019-12-07 ENCOUNTER — Other Ambulatory Visit: Payer: Self-pay

## 2019-12-07 ENCOUNTER — Encounter: Payer: Self-pay | Admitting: Physical Therapy

## 2019-12-07 ENCOUNTER — Ambulatory Visit: Payer: No Typology Code available for payment source | Attending: Internal Medicine | Admitting: Physical Therapy

## 2019-12-07 DIAGNOSIS — R296 Repeated falls: Secondary | ICD-10-CM | POA: Diagnosis not present

## 2019-12-07 DIAGNOSIS — R2681 Unsteadiness on feet: Secondary | ICD-10-CM | POA: Insufficient documentation
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
## 2020-04-17 ENCOUNTER — Telehealth: Payer: Self-pay

## 2020-04-17 NOTE — Telephone Encounter (Signed)
Called back and schedule appt for 5/4 at 1200. She is aware of appt.

## 2020-04-17 NOTE — Telephone Encounter (Signed)
Called and left a message asking her to call the office back. Scheduling appt with Dr. Alvy Bimler.

## 2020-05-01 ENCOUNTER — Inpatient Hospital Stay: Payer: PRIVATE HEALTH INSURANCE | Attending: Hematology and Oncology

## 2020-05-01 ENCOUNTER — Encounter: Payer: Self-pay | Admitting: Hematology and Oncology

## 2020-05-01 ENCOUNTER — Inpatient Hospital Stay (HOSPITAL_BASED_OUTPATIENT_CLINIC_OR_DEPARTMENT_OTHER): Payer: PRIVATE HEALTH INSURANCE | Admitting: Hematology and Oncology

## 2020-05-01 ENCOUNTER — Other Ambulatory Visit: Payer: Self-pay

## 2020-05-01 VITALS — BP 142/75 | HR 100 | Temp 98.0°F | Resp 18 | Wt 131.2 lb

## 2020-05-01 DIAGNOSIS — S42202A Unspecified fracture of upper end of left humerus, initial encounter for closed fracture: Secondary | ICD-10-CM | POA: Insufficient documentation

## 2020-05-01 DIAGNOSIS — Z791 Long term (current) use of non-steroidal anti-inflammatories (NSAID): Secondary | ICD-10-CM | POA: Diagnosis not present

## 2020-05-01 DIAGNOSIS — I10 Essential (primary) hypertension: Secondary | ICD-10-CM | POA: Diagnosis not present

## 2020-05-01 DIAGNOSIS — F17210 Nicotine dependence, cigarettes, uncomplicated: Secondary | ICD-10-CM

## 2020-05-01 DIAGNOSIS — Z79899 Other long term (current) drug therapy: Secondary | ICD-10-CM | POA: Diagnosis not present

## 2020-05-01 DIAGNOSIS — D472 Monoclonal gammopathy: Secondary | ICD-10-CM | POA: Diagnosis not present

## 2020-05-01 DIAGNOSIS — D7589 Other specified diseases of blood and blood-forming organs: Secondary | ICD-10-CM

## 2020-05-01 LAB — CBC WITH DIFFERENTIAL/PLATELET
Abs Immature Granulocytes: 0.02 10*3/uL (ref 0.00–0.07)
Basophils Absolute: 0.1 10*3/uL (ref 0.0–0.1)
Basophils Relative: 1 %
Eosinophils Absolute: 0 10*3/uL (ref 0.0–0.5)
Eosinophils Relative: 0 %
HCT: 38.8 % (ref 36.0–46.0)
Hemoglobin: 12.6 g/dL (ref 12.0–15.0)
Immature Granulocytes: 0 %
Lymphocytes Relative: 22 %
Lymphs Abs: 1.8 10*3/uL (ref 0.7–4.0)
MCH: 31.7 pg (ref 26.0–34.0)
MCHC: 32.5 g/dL (ref 30.0–36.0)
MCV: 97.7 fL (ref 80.0–100.0)
Monocytes Absolute: 0.4 10*3/uL (ref 0.1–1.0)
Monocytes Relative: 5 %
Neutro Abs: 5.6 10*3/uL (ref 1.7–7.7)
Neutrophils Relative %: 72 %
Platelets: 244 10*3/uL (ref 150–400)
RBC: 3.97 MIL/uL (ref 3.87–5.11)
RDW: 13.1 % (ref 11.5–15.5)
WBC: 7.9 10*3/uL (ref 4.0–10.5)
nRBC: 0 % (ref 0.0–0.2)

## 2020-05-01 LAB — COMPREHENSIVE METABOLIC PANEL
ALT: 15 U/L (ref 0–44)
AST: 16 U/L (ref 15–41)
Albumin: 4.1 g/dL (ref 3.5–5.0)
Alkaline Phosphatase: 78 U/L (ref 38–126)
Anion gap: 8 (ref 5–15)
BUN: 13 mg/dL (ref 8–23)
CO2: 26 mmol/L (ref 22–32)
Calcium: 9.5 mg/dL (ref 8.9–10.3)
Chloride: 99 mmol/L (ref 98–111)
Creatinine, Ser: 0.75 mg/dL (ref 0.44–1.00)
GFR calc Af Amer: >60 mL/min (ref >60)
GFR calc non Af Amer: >60 mL/min (ref >60)
Glucose, Bld: 91 mg/dL (ref 70–99)
Potassium: 4.8 mmol/L (ref 3.5–5.1)
Sodium: 133 mmol/L — ABNORMAL LOW (ref 135–145)
Total Bilirubin: 0.3 mg/dL (ref 0.3–1.2)
Total Protein: 8 g/dL (ref 6.5–8.1)

## 2020-05-01 LAB — VITAMIN B12: Vitamin B-12: 510 pg/mL (ref 180–914)

## 2020-05-01 LAB — LACTATE DEHYDROGENASE: LDH: 173 U/L (ref 98–192)

## 2020-05-01 LAB — SEDIMENTATION RATE: Sed Rate: 33 mm/hr — ABNORMAL HIGH (ref 0–22)

## 2020-05-01 NOTE — Assessment & Plan Note (Signed)
She is relatively asymptomatic She wants to wait until next year once she qualifies for Medicare before proceeding with surgery I recommend vitamin D supplement

## 2020-05-01 NOTE — Assessment & Plan Note (Signed)
TSH is normal I will order vitamin B12 to exclude B12 deficiency as a cause of macrocytosis

## 2020-05-01 NOTE — Assessment & Plan Note (Signed)
We discussed the natural history of MGUS She is not symptomatic I do not believe her recent traumatic fracture is due to this I will repeat labs and call her next week If her labs are stable, I will see her again in 1 year She is in agreement
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
## 2020-05-02 LAB — KAPPA/LAMBDA LIGHT CHAINS
Kappa free light chain: 22 mg/L — ABNORMAL HIGH (ref 3.3–19.4)
Kappa, lambda light chain ratio: 1.56 (ref 0.26–1.65)
Lambda free light chains: 14.1 mg/L (ref 5.7–26.3)

## 2020-05-04 LAB — MULTIPLE MYELOMA PANEL, SERUM
Albumin SerPl Elph-Mcnc: 3.8 g/dL (ref 2.9–4.4)
Albumin/Glob SerPl: 1.2 (ref 0.7–1.7)
Alpha 1: 0.3 g/dL (ref 0.0–0.4)
Alpha2 Glob SerPl Elph-Mcnc: 1 g/dL (ref 0.4–1.0)
B-Globulin SerPl Elph-Mcnc: 1.1 g/dL (ref 0.7–1.3)
Gamma Glob SerPl Elph-Mcnc: 1.1 g/dL (ref 0.4–1.8)
Globulin, Total: 3.4 g/dL (ref 2.2–3.9)
IgA: 168 mg/dL (ref 87–352)
IgG (Immunoglobin G), Serum: 986 mg/dL (ref 586–1602)
IgM (Immunoglobulin M), Srm: 194 mg/dL (ref 26–217)
M Protein SerPl Elph-Mcnc: 0.3 g/dL — ABNORMAL HIGH
Total Protein ELP: 7.2 g/dL (ref 6.0–8.5)

## 2020-05-10 ENCOUNTER — Other Ambulatory Visit: Payer: Self-pay | Admitting: Hematology and Oncology

## 2020-05-10 ENCOUNTER — Telehealth: Payer: Self-pay | Admitting: Hematology and Oncology

## 2020-05-10 DIAGNOSIS — D472 Monoclonal gammopathy: Secondary | ICD-10-CM

## 2020-05-10 NOTE — Telephone Encounter (Signed)
Review test results with the patient; MGUS is stable.  She does not have macrocytosis on repeat labs I recommend close surveillance once a year and she is in agreement I addressed all her questions

## 2020-05-11 ENCOUNTER — Telehealth: Payer: Self-pay | Admitting: Hematology and Oncology

## 2020-05-11 NOTE — Telephone Encounter (Signed)
Scheduled appt per 5/13 sch message - pt aware of appts added.

## 2021-05-02 ENCOUNTER — Inpatient Hospital Stay: Payer: Medicare Other | Attending: Hematology and Oncology

## 2021-05-02 ENCOUNTER — Other Ambulatory Visit: Payer: Self-pay

## 2021-05-02 DIAGNOSIS — M7918 Myalgia, other site: Secondary | ICD-10-CM | POA: Diagnosis not present

## 2021-05-02 DIAGNOSIS — D472 Monoclonal gammopathy: Secondary | ICD-10-CM | POA: Diagnosis not present

## 2021-05-02 LAB — COMPREHENSIVE METABOLIC PANEL
ALT: 21 U/L (ref 0–44)
AST: 19 U/L (ref 15–41)
Albumin: 3.7 g/dL (ref 3.5–5.0)
Alkaline Phosphatase: 77 U/L (ref 38–126)
Anion gap: 8 (ref 5–15)
BUN: 11 mg/dL (ref 8–23)
CO2: 26 mmol/L (ref 22–32)
Calcium: 9.3 mg/dL (ref 8.9–10.3)
Chloride: 100 mmol/L (ref 98–111)
Creatinine, Ser: 0.73 mg/dL (ref 0.44–1.00)
GFR, Estimated: >60 mL/min (ref >60)
Glucose, Bld: 77 mg/dL (ref 70–99)
Potassium: 4.5 mmol/L (ref 3.5–5.1)
Sodium: 134 mmol/L — ABNORMAL LOW (ref 135–145)
Total Bilirubin: <0.2 mg/dL — ABNORMAL LOW (ref 0.3–1.2)
Total Protein: 7.1 g/dL (ref 6.5–8.1)

## 2021-05-02 LAB — CBC WITH DIFFERENTIAL/PLATELET
Abs Immature Granulocytes: 0.01 10*3/uL (ref 0.00–0.07)
Basophils Absolute: 0.1 10*3/uL (ref 0.0–0.1)
Basophils Relative: 1 %
Eosinophils Absolute: 0 10*3/uL (ref 0.0–0.5)
Eosinophils Relative: 0 %
HCT: 38.1 % (ref 36.0–46.0)
Hemoglobin: 12.6 g/dL (ref 12.0–15.0)
Immature Granulocytes: 0 %
Lymphocytes Relative: 23 %
Lymphs Abs: 1.5 10*3/uL (ref 0.7–4.0)
MCH: 31.3 pg (ref 26.0–34.0)
MCHC: 33.1 g/dL (ref 30.0–36.0)
MCV: 94.5 fL (ref 80.0–100.0)
Monocytes Absolute: 0.4 10*3/uL (ref 0.1–1.0)
Monocytes Relative: 6 %
Neutro Abs: 4.4 10*3/uL (ref 1.7–7.7)
Neutrophils Relative %: 70 %
Platelets: 223 10*3/uL (ref 150–400)
RBC: 4.03 MIL/uL (ref 3.87–5.11)
RDW: 13 % (ref 11.5–15.5)
WBC: 6.3 10*3/uL (ref 4.0–10.5)
nRBC: 0 % (ref 0.0–0.2)

## 2021-05-03 LAB — KAPPA/LAMBDA LIGHT CHAINS
Kappa free light chain: 22.2 mg/L — ABNORMAL HIGH (ref 3.3–19.4)
Kappa, lambda light chain ratio: 2.04 — ABNORMAL HIGH (ref 0.26–1.65)
Lambda free light chains: 10.9 mg/L (ref 5.7–26.3)

## 2021-05-06 LAB — MULTIPLE MYELOMA PANEL, SERUM
Albumin SerPl Elph-Mcnc: 3.9 g/dL (ref 2.9–4.4)
Albumin/Glob SerPl: 1.4 (ref 0.7–1.7)
Alpha 1: 0.3 g/dL (ref 0.0–0.4)
Alpha2 Glob SerPl Elph-Mcnc: 0.9 g/dL (ref 0.4–1.0)
B-Globulin SerPl Elph-Mcnc: 0.9 g/dL (ref 0.7–1.3)
Gamma Glob SerPl Elph-Mcnc: 0.9 g/dL (ref 0.4–1.8)
Globulin, Total: 3 g/dL (ref 2.2–3.9)
IgA: 176 mg/dL (ref 87–352)
IgG (Immunoglobin G), Serum: 1041 mg/dL (ref 586–1602)
IgM (Immunoglobulin M), Srm: 210 mg/dL (ref 26–217)
M Protein SerPl Elph-Mcnc: 0.4 g/dL — ABNORMAL HIGH
Total Protein ELP: 6.9 g/dL (ref 6.0–8.5)

## 2021-05-08 ENCOUNTER — Other Ambulatory Visit: Payer: Self-pay | Admitting: Internal Medicine

## 2021-05-08 DIAGNOSIS — F17200 Nicotine dependence, unspecified, uncomplicated: Secondary | ICD-10-CM

## 2021-05-09 ENCOUNTER — Inpatient Hospital Stay: Payer: Medicare Other | Admitting: Hematology and Oncology

## 2021-05-09 ENCOUNTER — Encounter: Payer: Self-pay | Admitting: Hematology and Oncology

## 2021-05-09 ENCOUNTER — Other Ambulatory Visit: Payer: Self-pay

## 2021-05-09 DIAGNOSIS — M7918 Myalgia, other site: Secondary | ICD-10-CM | POA: Diagnosis not present

## 2021-05-09 DIAGNOSIS — G8929 Other chronic pain: Secondary | ICD-10-CM | POA: Insufficient documentation

## 2021-05-09 DIAGNOSIS — M810 Age-related osteoporosis without current pathological fracture: Secondary | ICD-10-CM | POA: Diagnosis not present

## 2021-05-09 DIAGNOSIS — D472 Monoclonal gammopathy: Secondary | ICD-10-CM | POA: Diagnosis not present

## 2021-05-09 NOTE — Assessment & Plan Note (Signed)
We discussed the natural history of MGUS She is not symptomatic I do not believe her recent traumatic fracture is due to this  Her diffuse musculoskeletal pain is also unrelated I plan to continue to see her once a year
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
## 2021-05-09 NOTE — Progress Notes (Signed)
[facility] OFFICE PROGRESS NOTE  Patient Care Team: Crist Infante, MD as PCP - General (Internal Medicine)  ASSESSMENT & PLAN:  MGUS (monoclonal gammopathy of unknown significance) We discussed the natural history of MGUS She is not symptomatic I do not believe her recent traumatic fracture is due to this  Her diffuse musculoskeletal pain is also unrelated I plan to continue to see her once a year  Chronic musculoskeletal pain She is known to have traumatic fracture, chronic musculoskeletal pain and osteoporosis Plan to check vitamin D level in her next visit She will continue vitamin D supplement with calcium We also discussed the importance of walking with the right posture and get supportive shoes to help prevent chronic back pain   Orders Placed This Encounter  Procedures  . CBC with Differential/Platelet    Standing Status:   Standing    Number of Occurrences:   22    Standing Expiration Date:   05/09/2022  . Comprehensive metabolic panel    Standing Status:   Standing    Number of Occurrences:   33    Standing Expiration Date:   05/09/2022  . Kappa/lambda light chains    Standing Status:   Standing    Number of Occurrences:   22    Standing Expiration Date:   05/09/2022  . Multiple Myeloma Panel (SPEP&IFE w/QIG)    Standing Status:   Standing    Number of Occurrences:   22    Standing Expiration Date:   05/09/2022  . VITAMIN D 25 Hydroxy (Vit-D Deficiency, Fractures)    Standing Status:   Future    Standing Expiration Date:   05/09/2022    All questions were answered. The patient knows to call the clinic with any problems, questions or concerns. The total time spent in the appointment was 20 minutes encounter with patients including review of chart and various tests results, discussions about plan of care and coordination of care plan   Heath Lark, MD 05/09/2021 10:14 AM  INTERVAL HISTORY: Please see below for problem oriented charting. She returns  today for further discussion and review of test results, for MGUS She has scoliosis and chronic back pain Her shoulder/humerus fracture has healed She has mild knee pain when she walks She have difficulties raising her left shoulder above her head level No recent infection, fever or chills  SUMMARY OF ONCOLOGIC HISTORY: Debbie Black is here because of MGUS, lost of follow-up She saw Dr. Julien Nordmann in 2007 and had bone marrow biopsy Report from Bone marrow: sowed it was hypercellular with 6% PLASMA CELLS.  She denies history of abnormal bone pain She had traumatic fall last September with fracture of humerus after a dizzy spell She told me she has history of osteoporosis  Patient denies history of recurrent infection or atypical infections such as shingles of meningitis. Denies chills, night sweats, anorexia or abnormal weight loss. She saw her primary care doctor recently who repeat labs. M protein was elevated at 0.3g. All labs are unremarkable except for mild macrocytosis with elevated MCV but no signs of anemia I have reviewed her electronic records; the plan is to monitor her blood counts with yearly visit  REVIEW OF SYSTEMS:   Constitutional: Denies fevers, chills or abnormal weight loss Eyes: Denies blurriness of vision Ears, nose, mouth, throat, and face: Denies mucositis or sore throat Respiratory: Denies cough, dyspnea or wheezes Cardiovascular: Denies palpitation, chest discomfort or lower extremity swelling Gastrointestinal:  Denies nausea, heartburn or  change in bowel habits Skin: Denies abnormal skin rashes Lymphatics: Denies new lymphadenopathy or easy bruising Neurological:Denies numbness, tingling or new weaknesses Behavioral/Psych: Mood is stable, no new changes  All other systems were reviewed with the patient and are negative.  I have reviewed the past medical history, past surgical history, social history and family history with the patient and they are unchanged  from previous note.  ALLERGIES:  is allergic to adhesive [tape], codeine, and latex.  MEDICATIONS:  Current Outpatient Medications  Medication Sig Dispense Refill  . fluticasone (FLONASE) 50 MCG/ACT nasal spray Place 1-2 sprays into both nostrils daily for 7 days. 1 g 0  . Ibuprofen (IBU PO) Take 3 tablets by mouth once.    . loratadine (CLARITIN) 10 MG tablet Take 10 mg by mouth daily.    Marland Kitchen olmesartan (BENICAR) 20 MG tablet Take 20 mg by mouth daily.    . traMADol (ULTRAM) 50 MG tablet Take 1 tablet (50 mg total) by mouth every 6 (six) hours as needed for severe pain. 15 tablet 0   No current facility-administered medications for this visit.    PHYSICAL EXAMINATION: ECOG PERFORMANCE STATUS: 1 - Symptomatic but completely ambulatory  Vitals:   05/09/21 0849  BP: (!) 151/67  Pulse: 85  Resp: 18  Temp: (!) 97.4 F (36.3 C)  SpO2: 100%   Filed Weights   05/09/21 0849  Weight: 132 lb 6.4 oz (60.1 kg)    GENERAL:alert, no distress and comfortable SKIN: skin color, texture, turgor are normal, no rashes or significant lesions EYES: normal, Conjunctiva are pink and non-injected, sclera clear OROPHARYNX:no exudate, no erythema and lips, buccal mucosa, and tongue normal  NECK: supple, thyroid normal size, non-tender, without nodularity LYMPH:  no palpable lymphadenopathy in the cervical, axillary or inguinal LUNGS: clear to auscultation and percussion with normal breathing effort HEART: regular rate & rhythm and no murmurs and no lower extremity edema ABDOMEN:abdomen soft, non-tender and normal bowel sounds Musculoskeletal:no cyanosis of digits and no clubbing.  She has difficulties raising her left shoulder, with signs of limitation due to prior surgery NEURO: alert & oriented x 3 with fluent speech, no focal motor/sensory deficits  LABORATORY DATA:  I have reviewed the data as listed    Component Value Date/Time   NA 134 (L) 05/02/2021 0921   K 4.5 05/02/2021 0921   CL 100  05/02/2021 0921   CO2 26 05/02/2021 0921   GLUCOSE 77 05/02/2021 0921   BUN 11 05/02/2021 0921   CREATININE 0.73 05/02/2021 0921   CALCIUM 9.3 05/02/2021 0921   PROT 7.1 05/02/2021 0921   ALBUMIN 3.7 05/02/2021 0921   AST 19 05/02/2021 0921   ALT 21 05/02/2021 0921   ALKPHOS 77 05/02/2021 0921   BILITOT <0.2 (L) 05/02/2021 0921   GFRNONAA >60 05/02/2021 0921   GFRAA >60 05/01/2020 1250    No results found for: SPEP, UPEP  Lab Results  Component Value Date   WBC 6.3 05/02/2021   NEUTROABS 4.4 05/02/2021   HGB 12.6 05/02/2021   HCT 38.1 05/02/2021   MCV 94.5 05/02/2021   PLT 223 05/02/2021      Chemistry      Component Value Date/Time   NA 134 (L) 05/02/2021 0921   K 4.5 05/02/2021 0921   CL 100 05/02/2021 0921   CO2 26 05/02/2021 0921   BUN 11 05/02/2021 0921   CREATININE 0.73 05/02/2021 0921      Component Value Date/Time   CALCIUM 9.3 05/02/2021 0921  ALKPHOS 77 05/02/2021 0921   AST 19 05/02/2021 0921   ALT 21 05/02/2021 0921   BILITOT <0.2 (L) 05/02/2021 2712

## 2021-05-31 ENCOUNTER — Ambulatory Visit: Payer: Medicare Other

## 2021-06-04 ENCOUNTER — Ambulatory Visit
Admission: RE | Admit: 2021-06-04 | Discharge: 2021-06-04 | Disposition: A | Discharge status: Home / Self Care | Payer: Medicare Other | Source: Ambulatory Visit | Attending: Internal Medicine | Admitting: Internal Medicine

## 2021-06-04 DIAGNOSIS — F17200 Nicotine dependence, unspecified, uncomplicated: Secondary | ICD-10-CM

## 2021-07-02 ENCOUNTER — Other Ambulatory Visit (HOSPITAL_COMMUNITY): Payer: Self-pay | Admitting: *Deleted

## 2021-07-03 ENCOUNTER — Ambulatory Visit (HOSPITAL_COMMUNITY)
Admission: RE | Admit: 2021-07-03 | Discharge: 2021-07-03 | Disposition: A | Discharge status: Home / Self Care | Payer: Medicare Other | Source: Ambulatory Visit | Attending: Internal Medicine | Admitting: Internal Medicine

## 2021-07-03 ENCOUNTER — Other Ambulatory Visit: Payer: Self-pay

## 2021-07-03 DIAGNOSIS — M810 Age-related osteoporosis without current pathological fracture: Secondary | ICD-10-CM | POA: Insufficient documentation

## 2021-07-03 MED ORDER — ZOLEDRONIC ACID 5 MG/100ML IV SOLN
5.0000 mg | Freq: Once | INTRAVENOUS | Status: AC
  Administered 2021-07-03: 5 mg via INTRAVENOUS

## 2021-07-03 MED ORDER — ZOLEDRONIC ACID 5 MG/100ML IV SOLN
INTRAVENOUS | Status: AC
  Filled 2021-07-03: qty 100

## 2022-04-18 ENCOUNTER — Other Ambulatory Visit: Payer: Self-pay | Admitting: Internal Medicine

## 2022-04-21 ENCOUNTER — Other Ambulatory Visit: Payer: Self-pay | Admitting: Internal Medicine

## 2022-04-21 DIAGNOSIS — F17200 Nicotine dependence, unspecified, uncomplicated: Secondary | ICD-10-CM

## 2022-04-25 NOTE — Progress Notes (Incomplete)
? ?Patient Care Team: ?Crist Infante, MD as PCP - General (Internal Medicine) ? ?DIAGNOSIS: No diagnosis found. ? ?SUMMARY OF ONCOLOGIC HISTORY: ?Oncology History  ? No history exists.  ? ? ?CHIEF COMPLIANT:  ? ?INTERVAL HISTORY: Debbie Black is a ? ? ?ALLERGIES:  is allergic to adhesive [tape], codeine, and latex. ? ?MEDICATIONS:  ?Current Outpatient Medications  ?Medication Sig Dispense Refill  ? fluticasone (FLONASE) 50 MCG/ACT nasal spray Place 1-2 sprays into both nostrils daily for 7 days. 1 g 0  ? Ibuprofen (IBU PO) Take 3 tablets by mouth once.    ? loratadine (CLARITIN) 10 MG tablet Take 10 mg by mouth daily.    ? olmesartan (BENICAR) 20 MG tablet Take 20 mg by mouth daily.    ? traMADol (ULTRAM) 50 MG tablet Take 1 tablet (50 mg total) by mouth every 6 (six) hours as needed for severe pain. 15 tablet 0  ? ?No current facility-administered medications for this visit.  ? ? ?PHYSICAL EXAMINATION: ?ECOG PERFORMANCE STATUS: {CHL ONC ECOG OZ:[id number]} ? ?There were no vitals filed for this visit. ?There were no vitals filed for this visit. ? ?BREAST:*** No palpable masses or nodules in either right or left breasts. No palpable axillary supraclavicular or infraclavicular adenopathy no breast tenderness or nipple discharge. (exam performed in the presence of a chaperone) ? ?LABORATORY DATA:  ?I have reviewed the data as listed ? ?  Latest Ref Rng & Units 05/02/2021  ?  9:21 AM 05/01/2020  ? 12:50 PM 04/04/2008  ?  9:17 AM  ?CMP  ?Glucose 70 - 99 mg/dL 77   91   91    ?BUN 8 - 23 mg/dL '11   13   15    '$ ?Creatinine 0.44 - 1.00 mg/dL 0.73   0.75   0.79    ?Sodium 135 - 145 mmol/L 134   133   141    ?Potassium 3.5 - 5.1 mmol/L 4.5   4.8   4.3    ?Chloride 98 - 111 mmol/L 100   99   104    ?CO2 22 - 32 mmol/L '26   26   26    '$ ?Calcium 8.9 - 10.3 mg/dL 9.3   9.5   9.5    ?Total Protein 6.5 - 8.1 g/dL 7.1   8.0   7.2    ?Total Bilirubin 0.3 - 1.2 mg/dL <0.2   0.3   0.4    ?Alkaline Phos 38 - 126 U/L 77   78   56    ?AST 15 -  41 U/L '19   16   14    '$ ?ALT 0 - 44 U/L '21   15   10    '$ ? ? ?Lab Results  ?Component Value Date  ? WBC 6.3 05/02/2021  ? HGB 12.6 05/02/2021  ? HCT 38.1 05/02/2021  ? MCV 94.5 05/02/2021  ? PLT 223 05/02/2021  ? NEUTROABS 4.4 05/02/2021  ? ? ?ASSESSMENT & PLAN:  ?No problem-specific Assessment & Plan notes found for this encounter. ? ? ? ?No orders of the defined types were placed in this encounter. ? ?The patient has a good understanding of the overall plan. she agrees with it. she will call with any problems that may develop before the next visit here. ?Total time spent: 30 mins including face to face time and time spent for planning, charting and co-ordination of care ? ? Suzzette Righter, CMA ?04/25/22 ? ? ? I Bennet Kujawa, Emanie Behan am  scribing for Dr. Lindi Adie ? ?***  ?

## 2022-05-02 ENCOUNTER — Other Ambulatory Visit: Payer: Self-pay

## 2022-05-02 ENCOUNTER — Inpatient Hospital Stay: Payer: Medicare Other | Attending: Hematology and Oncology

## 2022-05-02 DIAGNOSIS — Z79899 Other long term (current) drug therapy: Secondary | ICD-10-CM | POA: Insufficient documentation

## 2022-05-02 DIAGNOSIS — M7918 Myalgia, other site: Secondary | ICD-10-CM

## 2022-05-02 DIAGNOSIS — D472 Monoclonal gammopathy: Secondary | ICD-10-CM | POA: Insufficient documentation

## 2022-05-02 DIAGNOSIS — R5382 Chronic fatigue, unspecified: Secondary | ICD-10-CM | POA: Diagnosis not present

## 2022-05-02 DIAGNOSIS — M810 Age-related osteoporosis without current pathological fracture: Secondary | ICD-10-CM

## 2022-05-02 LAB — CBC WITH DIFFERENTIAL/PLATELET
Abs Immature Granulocytes: 0.02 10*3/uL (ref 0.00–0.07)
Basophils Absolute: 0.1 10*3/uL (ref 0.0–0.1)
Basophils Relative: 2 %
Eosinophils Absolute: 0 10*3/uL (ref 0.0–0.5)
Eosinophils Relative: 0 %
HCT: 39.2 % (ref 36.0–46.0)
Hemoglobin: 13.1 g/dL (ref 12.0–15.0)
Immature Granulocytes: 0 %
Lymphocytes Relative: 23 %
Lymphs Abs: 1.6 10*3/uL (ref 0.7–4.0)
MCH: 31.3 pg (ref 26.0–34.0)
MCHC: 33.4 g/dL (ref 30.0–36.0)
MCV: 93.8 fL (ref 80.0–100.0)
Monocytes Absolute: 0.3 10*3/uL (ref 0.1–1.0)
Monocytes Relative: 5 %
Neutro Abs: 4.8 10*3/uL (ref 1.7–7.7)
Neutrophils Relative %: 70 %
Platelets: 218 10*3/uL (ref 150–400)
RBC: 4.18 MIL/uL (ref 3.87–5.11)
RDW: 13.3 % (ref 11.5–15.5)
WBC: 6.8 10*3/uL (ref 4.0–10.5)
nRBC: 0 % (ref 0.0–0.2)

## 2022-05-02 LAB — COMPREHENSIVE METABOLIC PANEL
ALT: 15 U/L (ref 0–44)
AST: 16 U/L (ref 15–41)
Albumin: 4.2 g/dL (ref 3.5–5.0)
Alkaline Phosphatase: 66 U/L (ref 38–126)
Anion gap: 8 (ref 5–15)
BUN: 13 mg/dL (ref 8–23)
CO2: 26 mmol/L (ref 22–32)
Calcium: 9.3 mg/dL (ref 8.9–10.3)
Chloride: 98 mmol/L (ref 98–111)
Creatinine, Ser: 0.74 mg/dL (ref 0.44–1.00)
GFR, Estimated: >60 mL/min (ref >60)
Glucose, Bld: 93 mg/dL (ref 70–99)
Potassium: 4.4 mmol/L (ref 3.5–5.1)
Sodium: 132 mmol/L — ABNORMAL LOW (ref 135–145)
Total Bilirubin: 0.3 mg/dL (ref 0.3–1.2)
Total Protein: 8 g/dL (ref 6.5–8.1)

## 2022-05-05 LAB — KAPPA/LAMBDA LIGHT CHAINS
Kappa free light chain: 27.5 mg/L — ABNORMAL HIGH (ref 3.3–19.4)
Kappa, lambda light chain ratio: 2.86 — ABNORMAL HIGH (ref 0.26–1.65)
Lambda free light chains: 9.6 mg/L (ref 5.7–26.3)

## 2022-05-06 LAB — MULTIPLE MYELOMA PANEL, SERUM
Albumin SerPl Elph-Mcnc: 4.1 g/dL (ref 2.9–4.4)
Albumin/Glob SerPl: 1.3 (ref 0.7–1.7)
Alpha 1: 0.3 g/dL (ref 0.0–0.4)
Alpha2 Glob SerPl Elph-Mcnc: 1 g/dL (ref 0.4–1.0)
B-Globulin SerPl Elph-Mcnc: 0.9 g/dL (ref 0.7–1.3)
Gamma Glob SerPl Elph-Mcnc: 1.2 g/dL (ref 0.4–1.8)
Globulin, Total: 3.4 g/dL (ref 2.2–3.9)
IgA: 180 mg/dL (ref 87–352)
IgG (Immunoglobin G), Serum: 1161 mg/dL (ref 586–1602)
IgM (Immunoglobulin M), Srm: 206 mg/dL (ref 26–217)
M Protein SerPl Elph-Mcnc: 0.5 g/dL — ABNORMAL HIGH
Total Protein ELP: 7.5 g/dL (ref 6.0–8.5)

## 2022-05-09 ENCOUNTER — Encounter: Payer: Self-pay | Admitting: Hematology and Oncology

## 2022-05-09 ENCOUNTER — Other Ambulatory Visit: Payer: Self-pay

## 2022-05-09 ENCOUNTER — Inpatient Hospital Stay: Payer: Medicare Other | Admitting: Hematology and Oncology

## 2022-05-09 DIAGNOSIS — R5382 Chronic fatigue, unspecified: Secondary | ICD-10-CM

## 2022-05-09 DIAGNOSIS — D472 Monoclonal gammopathy: Secondary | ICD-10-CM | POA: Diagnosis not present

## 2022-05-09 NOTE — Assessment & Plan Note (Signed)
Her blood count is normal ?I suspect her chronic fatigue is unrelated, likely due to poor sleep hygiene and irregular exercise schedule ?The patient is reassured ?

## 2022-05-09 NOTE — Assessment & Plan Note (Signed)
We discussed the natural history of MGUS ?She is not symptomatic ?I plan to continue to see her once a year ?

## 2022-05-09 NOTE — Progress Notes (Signed)
[facility] ?OFFICE PROGRESS NOTE ? ?Patient Care Team: ?Crist Infante, MD as PCP - General (Internal Medicine) ? ?ASSESSMENT & PLAN:  ?MGUS (monoclonal gammopathy of unknown significance) ?We discussed the natural history of MGUS ?She is not symptomatic ?I plan to continue to see her once a year ? ?Chronic fatigue ?Her blood count is normal ?I suspect her chronic fatigue is unrelated, likely due to poor sleep hygiene and irregular exercise schedule ?The patient is reassured ? ?No orders of the defined types were placed in this encounter. ? ? ?All questions were answered. The patient knows to call the clinic with any problems, questions or concerns. ?The total time spent in the appointment was 20 minutes encounter with patients including review of chart and various tests results, discussions about plan of care and coordination of care plan ?  ?Heath Lark, MD ?05/09/2022 11:53 AM ? ?INTERVAL HISTORY: ?Please see below for problem oriented charting. 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 ?REVIEW OF SYSTEMS:   ?Constitutional: Denies fevers, chills or abnormal weight loss ?Eyes: Denies blurriness of vision ?Ears, nose, mouth, throat, and face: Denies mucositis or sore throat ?Respiratory: Denies cough, dyspnea or wheezes ?Cardiovascular: Denies palpitation, chest discomfort or lower extremity swelling ?Gastrointestinal:  Denies nausea, heartburn or change in bowel habits ?Skin: Denies abnormal skin rashes ?Lymphatics: Denies new lymphadenopathy or easy bruising ?Neurological:Denies numbness, tingling or new weaknesses ?Behavioral/Psych: Mood is stable, no new changes  ?All other systems were reviewed with the patient and are negative. ? ?I have reviewed the past medical history, past surgical history, social history and family history with the patient and they are unchanged from previous note. ? ?ALLERGIES:  is  allergic to adhesive [tape], codeine, and latex. ? ?MEDICATIONS:  ?Current Outpatient Medications  ?Medication Sig Dispense Refill  ? fluticasone (FLONASE) 50 MCG/ACT nasal spray Place 1-2 sprays into both nostrils daily for 7 days. 1 g 0  ? loratadine (CLARITIN) 10 MG tablet Take 10 mg by mouth daily.    ? olmesartan (BENICAR) 20 MG tablet Take 20 mg by mouth daily.    ? ?No current facility-administered medications for this visit.  ? ? ?SUMMARY OF ONCOLOGIC HISTORY: ?SHAWANA KNOCH is here because of MGUS, lost of follow-up ?She saw Dr. Julien Nordmann in 2007 and had bone marrow biopsy ?Report from Bone marrow: sowed it was hypercellular with 6% PLASMA CELLS.  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 ?PHYSICAL EXAMINATION: ?ECOG PERFORMANCE STATUS: 1 - Symptomatic but completely ambulatory ? ?Vitals:  ? 05/09/22 0834  ?BP: (!) 144/62  ?Pulse: 76  ?Resp: 16  ?Temp: 97.7 ?F (36.5 ?C)  ?SpO2: 100%  ? ?Filed Weights  ? 05/09/22 0834  ?Weight: 137 lb 9.6 oz (62.4 kg)  ? ? ?GENERAL:alert, no distress and comfortable ?NEURO: alert & oriented x 3 with fluent speech, no focal motor/sensory deficits ? ?LABORATORY DATA:  ?I have reviewed the data as listed ?   ?Component Value Date/Time  ? NA 132 (L) 05/02/2022 0806  ? K 4.4 05/02/2022 0806  ? CL 98 05/02/2022 0806  ? CO2 26 05/02/2022 0806  ? GLUCOSE 93 05/02/2022 0806  ? BUN 13 05/02/2022 0806  ? CREATININE 0.74 05/02/2022 0806  ? CALCIUM 9.3 05/02/2022 0806  ? PROT 8.0 05/02/2022 0806  ? ALBUMIN 4.2 05/02/2022 0806  ? AST 16 05/02/2022 0806  ?  ALT 15 05/02/2022 0806  ? ALKPHOS 66 05/02/2022 0806  ? BILITOT 0.3 05/02/2022 0806  ? GFRNONAA >60 05/02/2022 0806  ? GFRAA >60 05/01/2020 1250  ? ? ?No results found for: SPEP, UPEP ? ?Lab Results  ?Component Value Date  ? WBC 6.8 05/02/2022  ? NEUTROABS 4.8 05/02/2022  ? HGB 13.1 05/02/2022  ? HCT 39.2 05/02/2022  ? MCV 93.8 05/02/2022  ? PLT 218 05/02/2022  ? ? ?  Chemistry   ?    ?Component Value Date/Time  ? NA 132 (L) 05/02/2022 0806  ? K 4.4 05/02/2022 0806  ? CL 98 05/02/2022 0806  ? CO2 26 05/02/2022 0806  ? BUN 13 05/02/2022 0806  ? CREATININE 0.74 05/02/2022 0806  ?    ?Component Value Date/Time  ? CALCIUM 9.3 05/02/2022 0806  ? ALKPHOS 66 05/02/2022 0806  ? AST 16 05/02/2022 0806  ? ALT 15 05/02/2022 0806  ? BILITOT 0.3 05/02/2022 0806  ?  ? ? ?

## 2022-07-08 ENCOUNTER — Inpatient Hospital Stay: Admission: RE | Admit: 2022-07-08 | Payer: Medicare Other | Source: Ambulatory Visit

## 2022-07-08 ENCOUNTER — Ambulatory Visit
Admission: RE | Admit: 2022-07-08 | Discharge: 2022-07-08 | Disposition: A | Discharge status: Home / Self Care | Payer: Medicare Other | Source: Ambulatory Visit | Attending: Internal Medicine | Admitting: Internal Medicine

## 2022-07-08 DIAGNOSIS — F17200 Nicotine dependence, unspecified, uncomplicated: Secondary | ICD-10-CM

## 2022-09-16 ENCOUNTER — Other Ambulatory Visit: Payer: Self-pay | Admitting: Family Medicine

## 2022-09-16 ENCOUNTER — Ambulatory Visit
Admission: RE | Admit: 2022-09-16 | Discharge: 2022-09-16 | Disposition: A | Discharge status: Home / Self Care | Payer: Medicare Other | Source: Ambulatory Visit | Attending: Family Medicine | Admitting: Family Medicine

## 2022-09-16 DIAGNOSIS — M79671 Pain in right foot: Secondary | ICD-10-CM

## 2023-04-30 ENCOUNTER — Encounter: Payer: Self-pay | Admitting: Hematology and Oncology

## 2023-05-01 ENCOUNTER — Inpatient Hospital Stay: Payer: Medicare Other

## 2023-05-01 ENCOUNTER — Telehealth: Payer: Self-pay

## 2023-05-01 NOTE — Telephone Encounter (Signed)
Called regarding mychart message and left a message to call the office.  She showed up for appts today. Spoke with her in the lobby. Appts canceled for today and rescheduled per Dr. Bertis Ruddy due to taking antibiotics and this can affect myeloma interpretation. Printed appt schedule and she is aware of appts.

## 2023-05-11 ENCOUNTER — Inpatient Hospital Stay: Payer: Medicare Other | Admitting: Hematology and Oncology

## 2023-05-12 ENCOUNTER — Other Ambulatory Visit: Payer: Self-pay | Admitting: Internal Medicine

## 2023-05-12 ENCOUNTER — Encounter: Payer: Self-pay | Admitting: Internal Medicine

## 2023-05-12 DIAGNOSIS — F17209 Nicotine dependence, unspecified, with unspecified nicotine-induced disorders: Secondary | ICD-10-CM

## 2023-05-12 DIAGNOSIS — F17200 Nicotine dependence, unspecified, uncomplicated: Secondary | ICD-10-CM

## 2023-05-14 ENCOUNTER — Inpatient Hospital Stay: Payer: Medicare Other | Attending: Hematology and Oncology

## 2023-05-14 DIAGNOSIS — M810 Age-related osteoporosis without current pathological fracture: Secondary | ICD-10-CM | POA: Insufficient documentation

## 2023-05-14 DIAGNOSIS — G8929 Other chronic pain: Secondary | ICD-10-CM

## 2023-05-14 DIAGNOSIS — D472 Monoclonal gammopathy: Secondary | ICD-10-CM | POA: Diagnosis present

## 2023-05-14 DIAGNOSIS — Z79899 Other long term (current) drug therapy: Secondary | ICD-10-CM | POA: Insufficient documentation

## 2023-05-14 LAB — CBC WITH DIFFERENTIAL/PLATELET
Abs Immature Granulocytes: 0.01 10*3/uL (ref 0.00–0.07)
Basophils Absolute: 0.1 10*3/uL (ref 0.0–0.1)
Basophils Relative: 1 %
Eosinophils Absolute: 0 10*3/uL (ref 0.0–0.5)
Eosinophils Relative: 0 %
HCT: 41 % (ref 36.0–46.0)
Hemoglobin: 13.7 g/dL (ref 12.0–15.0)
Immature Granulocytes: 0 %
Lymphocytes Relative: 20 %
Lymphs Abs: 1.4 10*3/uL (ref 0.7–4.0)
MCH: 31.3 pg (ref 26.0–34.0)
MCHC: 33.4 g/dL (ref 30.0–36.0)
MCV: 93.6 fL (ref 80.0–100.0)
Monocytes Absolute: 0.3 10*3/uL (ref 0.1–1.0)
Monocytes Relative: 5 %
Neutro Abs: 5 10*3/uL (ref 1.7–7.7)
Neutrophils Relative %: 74 %
Platelets: 203 10*3/uL (ref 150–400)
RBC: 4.38 MIL/uL (ref 3.87–5.11)
RDW: 13.2 % (ref 11.5–15.5)
WBC: 6.7 10*3/uL (ref 4.0–10.5)
nRBC: 0 % (ref 0.0–0.2)

## 2023-05-14 LAB — COMPREHENSIVE METABOLIC PANEL
ALT: 18 U/L (ref 0–44)
AST: 19 U/L (ref 15–41)
Albumin: 4.1 g/dL (ref 3.5–5.0)
Alkaline Phosphatase: 60 U/L (ref 38–126)
Anion gap: 6 (ref 5–15)
BUN: 13 mg/dL (ref 8–23)
CO2: 28 mmol/L (ref 22–32)
Calcium: 9.4 mg/dL (ref 8.9–10.3)
Chloride: 97 mmol/L — ABNORMAL LOW (ref 98–111)
Creatinine, Ser: 0.75 mg/dL (ref 0.44–1.00)
GFR, Estimated: >60 mL/min (ref >60)
Glucose, Bld: 91 mg/dL (ref 70–99)
Potassium: 4.9 mmol/L (ref 3.5–5.1)
Sodium: 131 mmol/L — ABNORMAL LOW (ref 135–145)
Total Bilirubin: 0.3 mg/dL (ref 0.3–1.2)
Total Protein: 7.6 g/dL (ref 6.5–8.1)

## 2023-05-14 LAB — VITAMIN D 25 HYDROXY (VIT D DEFICIENCY, FRACTURES): Vit D, 25-Hydroxy: 47.59 ng/mL (ref 30–100)

## 2023-05-15 LAB — KAPPA/LAMBDA LIGHT CHAINS
Kappa free light chain: 33.3 mg/L — ABNORMAL HIGH (ref 3.3–19.4)
Kappa, lambda light chain ratio: 2.49 — ABNORMAL HIGH (ref 0.26–1.65)
Lambda free light chains: 13.4 mg/L (ref 5.7–26.3)

## 2023-05-17 IMAGING — CT CT CHEST LUNG CANCER SCREENING LOW DOSE W/O CM
2 of 5 series · 15 of 40 positions shown, 18 images · non-contrast
Comparison: None.

CLINICAL DATA: Nicotine dependency. Forty-five pack-year history.
Current asymptomatic smoker.

EXAM:
CT CHEST WITHOUT CONTRAST LOW-DOSE FOR LUNG CANCER SCREENING
TECHNIQUE: Multidetector CT imaging of the chest was performed following the
standard protocol without IV contrast.

[Series 4: lung 1.00 br44 cor · coronal · 0.60mm/px · 3 of 311 slices shown]
[im 63/311  lung]
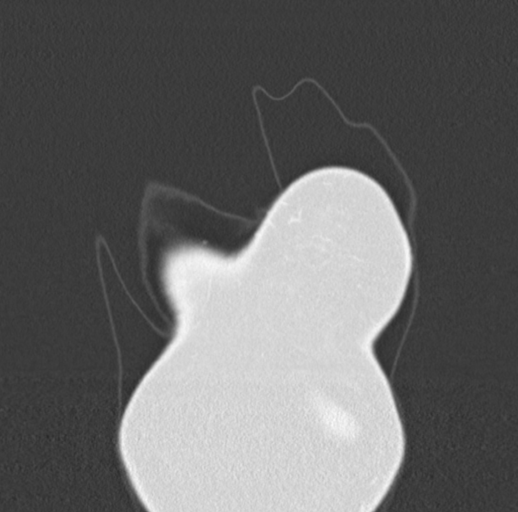
[im 125/311  lung]
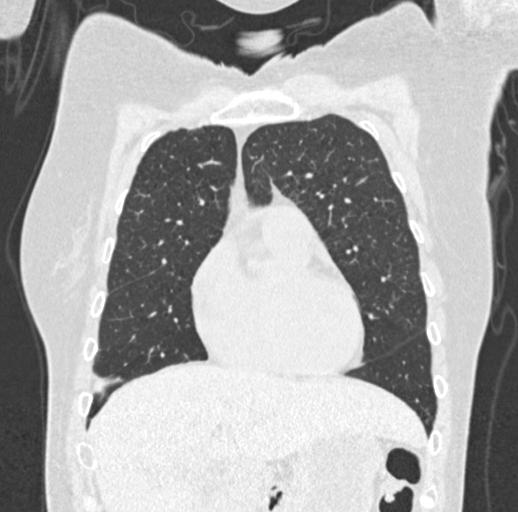
[im 187/311  lung]
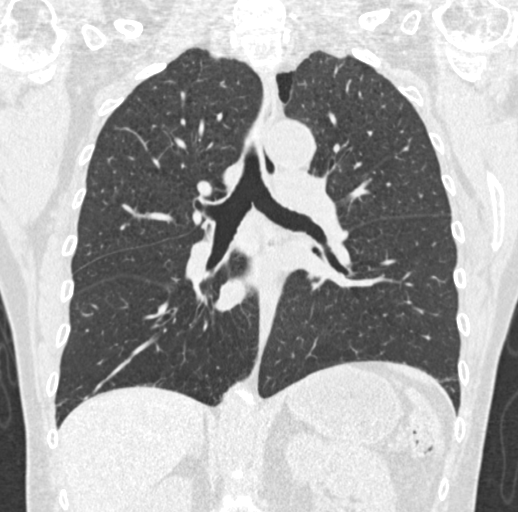

[Series 9: lung 1.00 br60 axial · axial · 0.61mm/px · z∈[-1113,-835]mm · 12 of 308 slices shown, 15 images]
[im 15/308  mediastinal]
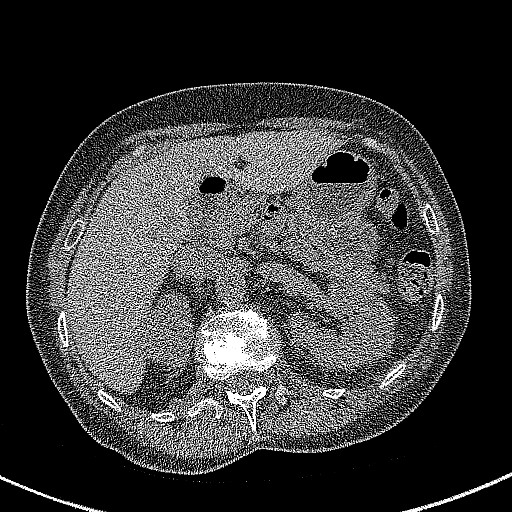
[im 15/308  lung]
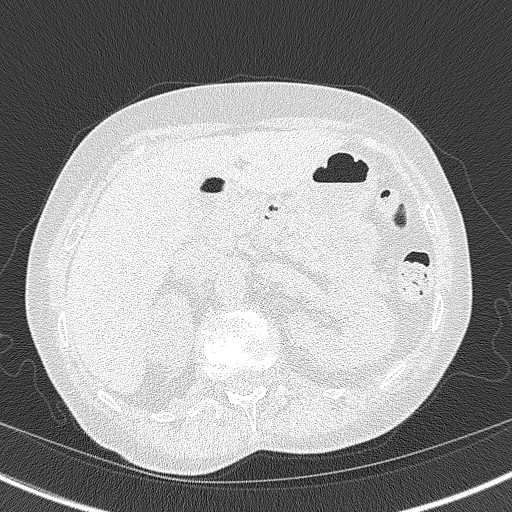
[im 44/308  lung]
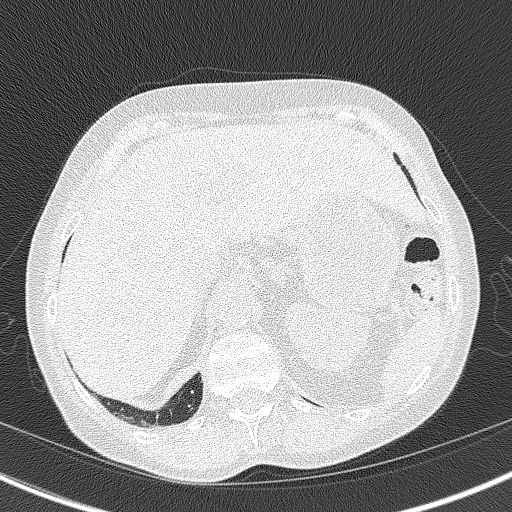
[im 74/308  lung]
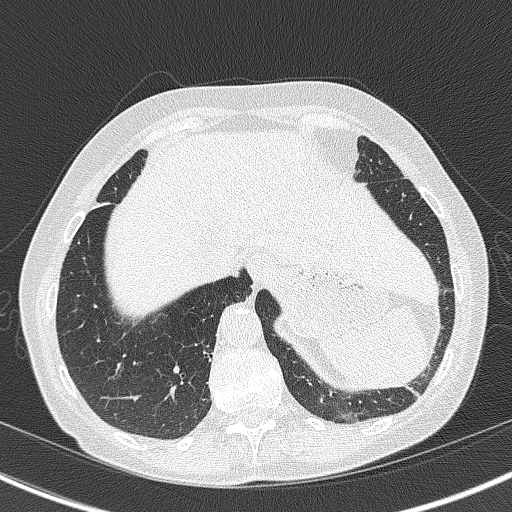
[im 88/308  lung]
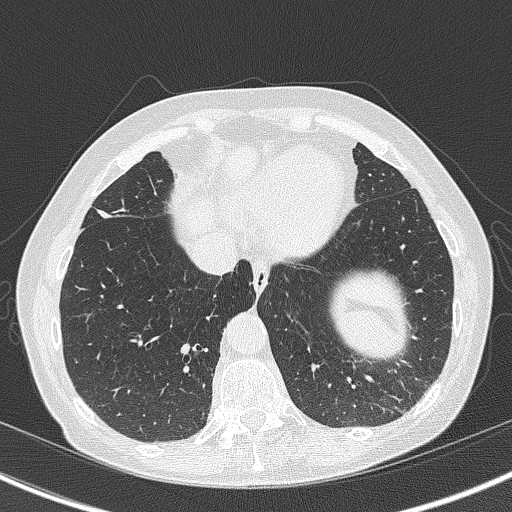
[im 117/308  mediastinal]
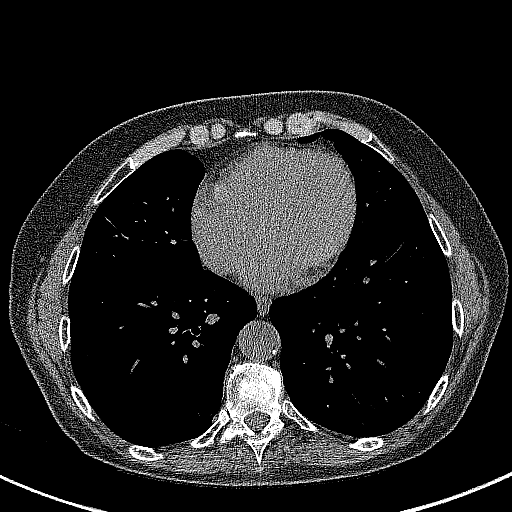
[im 117/308  lung]
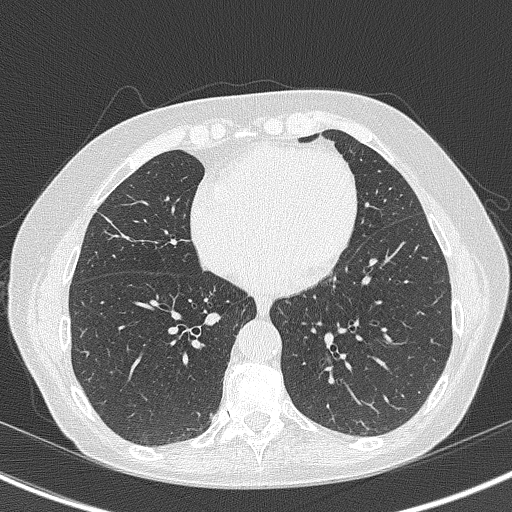
[im 147/308  lung]
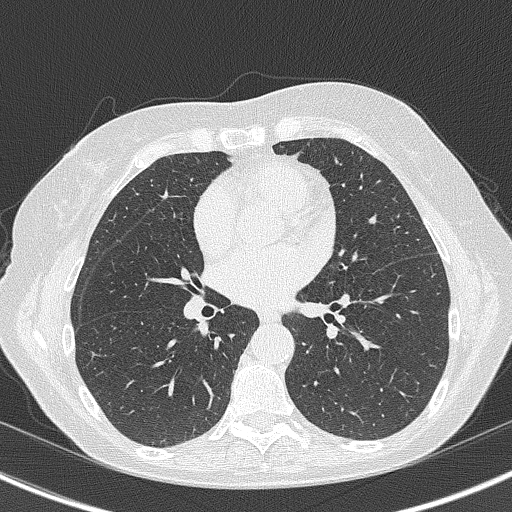
[im 161/308  lung]
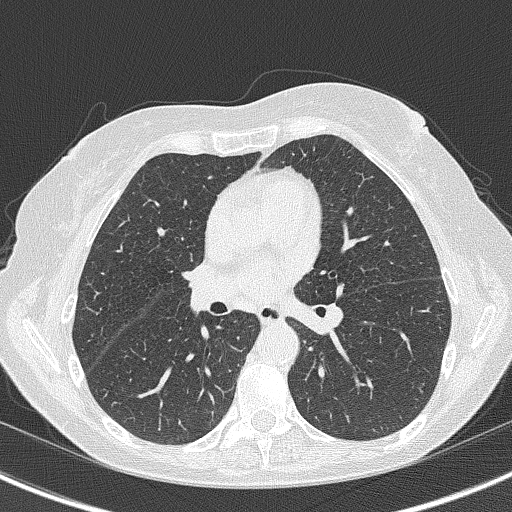
[im 191/308  lung]
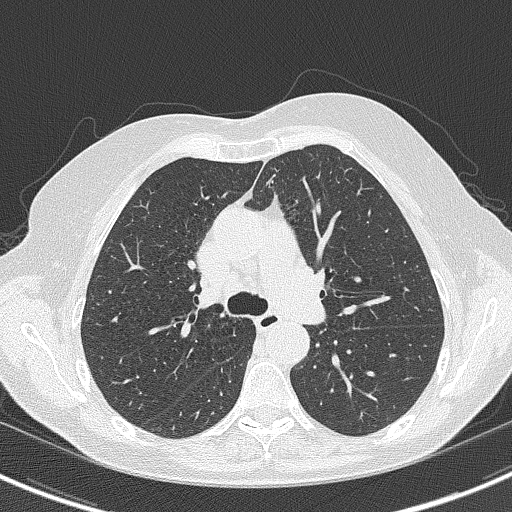
[im 220/308  mediastinal]
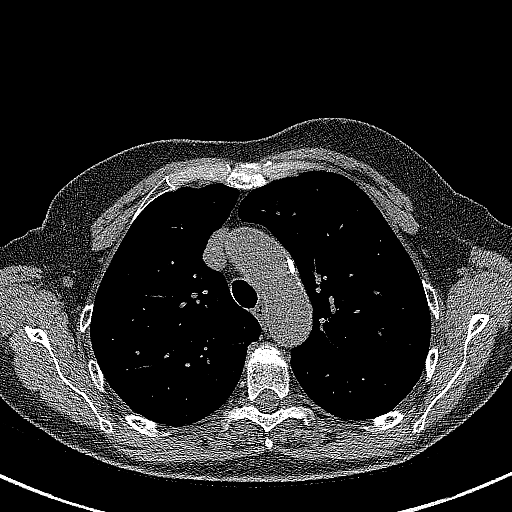
[im 220/308  lung]
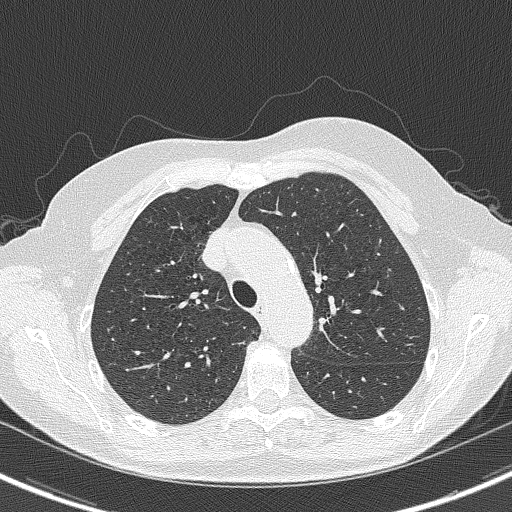
[im 234/308  lung]
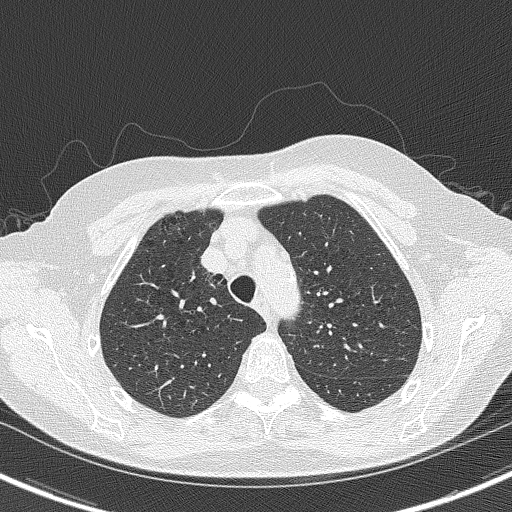
[im 264/308  lung]
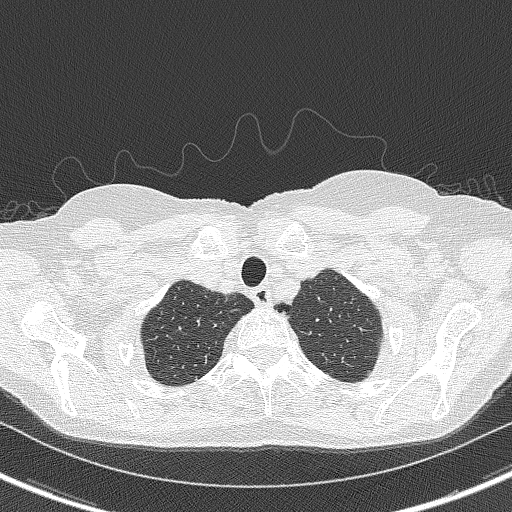
[im 293/308  lung]
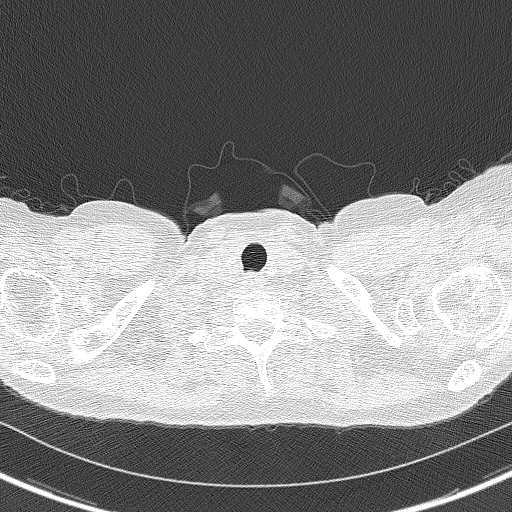

[15 of 40 positions shown; findings below may reference images not displayed]

FINDINGS: Cardiovascular: Normal heart size. No pericardial effusion. Aortic
atherosclerosis.

Mediastinum/Nodes: No enlarged mediastinal, hilar, or axillary lymph
nodes. Thyroid gland, trachea, and esophagus demonstrate no
significant findings.

Lungs/Pleura: Moderate centrilobular and paraseptal emphysema with
diffuse bronchial wall thickening. No pleural effusion, airspace
consolidation or atelectasis. Scarring noted within the right middle
lobe and left lung base. No suspicious lung nodules identified.

Upper Abdomen: No acute abnormality.

Musculoskeletal: No chest wall mass or suspicious bone lesions
identified.
IMPRESSION: 1. Lung-RADS 1, negative. Continue annual screening with low-dose
chest CT without contrast in 12 months.
2. Diffuse bronchial wall thickening with emphysema, as above;
imaging findings suggestive of underlying COPD.

Aortic Atherosclerosis (6RSO1-EX3.3) and Emphysema (6RSO1-KSB.U).

## 2023-05-21 LAB — MULTIPLE MYELOMA PANEL, SERUM
Albumin SerPl Elph-Mcnc: 3.5 g/dL (ref 2.9–4.4)
Albumin/Glob SerPl: 1.1 (ref 0.7–1.7)
Alpha 1: 0.4 g/dL (ref 0.0–0.4)
Alpha2 Glob SerPl Elph-Mcnc: 1 g/dL (ref 0.4–1.0)
B-Globulin SerPl Elph-Mcnc: 0.9 g/dL (ref 0.7–1.3)
Gamma Glob SerPl Elph-Mcnc: 1.2 g/dL (ref 0.4–1.8)
Globulin, Total: 3.5 g/dL (ref 2.2–3.9)
IgA: 177 mg/dL (ref 87–352)
IgG (Immunoglobin G), Serum: 1252 mg/dL (ref 586–1602)
IgM (Immunoglobulin M), Srm: 210 mg/dL (ref 26–217)
M Protein SerPl Elph-Mcnc: 0.4 g/dL — ABNORMAL HIGH
Total Protein ELP: 7 g/dL (ref 6.0–8.5)

## 2023-05-26 ENCOUNTER — Inpatient Hospital Stay (HOSPITAL_BASED_OUTPATIENT_CLINIC_OR_DEPARTMENT_OTHER): Payer: Medicare Other | Admitting: Hematology and Oncology

## 2023-05-26 ENCOUNTER — Encounter: Payer: Self-pay | Admitting: Hematology and Oncology

## 2023-05-26 VITALS — BP 149/58 | HR 86 | Temp 97.8°F | Resp 18 | Ht 60.0 in | Wt 131.8 lb

## 2023-05-26 DIAGNOSIS — D472 Monoclonal gammopathy: Secondary | ICD-10-CM

## 2023-05-26 DIAGNOSIS — W19XXXA Unspecified fall, initial encounter: Secondary | ICD-10-CM

## 2023-05-26 NOTE — Progress Notes (Signed)
[facility] OFFICE PROGRESS NOTE  Patient Care Team: Rodrigo Ran, MD as PCP - General (Internal Medicine)  ASSESSMENT & PLAN:  MGUS (monoclonal gammopathy of unknown significance) We discussed the natural history of MGUS She is not symptomatic I plan to continue to see her once a year  Fall The patient had 2 episodes of fall I recommend physical therapy and rehab and she agrees  Orders Placed This Encounter  Procedures   Ambulatory referral to Physical Therapy    Referral Priority:   Routine    Referral Type:   Physical Medicine    Referral Reason:   Specialty Services Required    Requested Specialty:   Physical Therapy    Number of Visits Requested:   1    All questions were answered. The patient knows to call the clinic with any problems, questions or concerns. The total time spent in the appointment was 20 minutes encounter with patients including review of chart and various tests results, discussions about plan of care and coordination of care plan   Artis Delay, MD 05/26/2023 10:13 AM  INTERVAL HISTORY: Please see below for problem oriented charting. she returns for surveillance follow-up for history of MGUS Since her last visit, she had 2 episodes of fall Once was when she was walking down the trail, while another episode when she slipped after a rainy day We discussed precautions and recommendation for physical therapy and rehab She denies recent fracture of her bones  REVIEW OF SYSTEMS:   Constitutional: Denies fevers, chills or abnormal weight loss Eyes: Denies blurriness of vision Ears, nose, mouth, throat, and face: Denies mucositis or sore throat Respiratory: Denies cough, dyspnea or wheezes Cardiovascular: Denies palpitation, chest discomfort or lower extremity swelling Gastrointestinal:  Denies nausea, heartburn or change in bowel habits Skin: Denies abnormal skin rashes Lymphatics: Denies new lymphadenopathy or easy  bruising Neurological:Denies numbness, tingling or new weaknesses Behavioral/Psych: Mood is stable, no new changes  All other systems were reviewed with the patient and are negative.  I have reviewed the past medical history, past surgical history, social history and family history with the patient and they are unchanged from previous note.  ALLERGIES:  is allergic to adhesive [tape], codeine, and latex.  MEDICATIONS:  Current Outpatient Medications  Medication Sig Dispense Refill   Cetirizine HCl (ZYRTEC ALLERGY) 10 MG CAPS Take 5 mg by mouth daily.     SINGULAIR 10 MG tablet Take 5 mg by mouth daily.     fluticasone (FLONASE) 50 MCG/ACT nasal spray Place 1-2 sprays into both nostrils daily for 7 days. 1 g 0   olmesartan (BENICAR) 20 MG tablet Take 20 mg by mouth daily.     rosuvastatin (CRESTOR) 5 MG tablet Take 10 mg by mouth daily.     No current facility-administered medications for this visit.    SUMMARY OF ONCOLOGIC HISTORY:  Debbie Black is here because of MGUS, lost of follow-up She saw Dr. Arbutus Ped in 2007 and had bone marrow biopsy Report from Bone marrow: sowed it was hypercellular with 6% PLASMA CELLS.  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  PHYSICAL EXAMINATION: ECOG PERFORMANCE STATUS: 1 - Symptomatic but completely ambulatory  Vitals:   05/26/23 0849  BP: (!) 149/58  Pulse: 86  Resp: 18  Temp: 97.8 F (36.6 C)  SpO2: 98%   Filed Weights   05/26/23 0849  Weight: 131 lb 12.8 oz (59.8 kg)  GENERAL:alert, no distress and comfortable NEURO: alert & oriented x 3 with fluent speech, no focal motor/sensory deficits  LABORATORY DATA:  I have reviewed the data as listed    Component Value Date/Time   NA 131 (L) 05/14/2023 0800   K 4.9 05/14/2023 0800   CL 97 (L) 05/14/2023 0800   CO2 28 05/14/2023 0800   GLUCOSE 91 05/14/2023 0800   BUN 13 05/14/2023 0800   CREATININE 0.75 05/14/2023 0800    CALCIUM 9.4 05/14/2023 0800   PROT 7.6 05/14/2023 0800   ALBUMIN 4.1 05/14/2023 0800   AST 19 05/14/2023 0800   ALT 18 05/14/2023 0800   ALKPHOS 60 05/14/2023 0800   BILITOT 0.3 05/14/2023 0800   GFRNONAA >60 05/14/2023 0800   GFRAA >60 05/01/2020 1250    No results found for: "SPEP", "UPEP"  Lab Results  Component Value Date   WBC 6.7 05/14/2023   NEUTROABS 5.0 05/14/2023   HGB 13.7 05/14/2023   HCT 41.0 05/14/2023   MCV 93.6 05/14/2023   PLT 203 05/14/2023      Chemistry      Component Value Date/Time   NA 131 (L) 05/14/2023 0800   K 4.9 05/14/2023 0800   CL 97 (L) 05/14/2023 0800   CO2 28 05/14/2023 0800   BUN 13 05/14/2023 0800   CREATININE 0.75 05/14/2023 0800      Component Value Date/Time   CALCIUM 9.4 05/14/2023 0800   ALKPHOS 60 05/14/2023 0800   AST 19 05/14/2023 0800   ALT 18 05/14/2023 0800   BILITOT 0.3 05/14/2023 0800

## 2023-05-26 NOTE — Assessment & Plan Note (Signed)
We discussed the natural history of MGUS ?She is not symptomatic ?I plan to continue to see her once a year ?

## 2023-05-26 NOTE — Assessment & Plan Note (Signed)
The patient had 2 episodes of fall I recommend physical therapy and rehab and she agrees

## 2023-05-27 ENCOUNTER — Telehealth: Payer: Self-pay | Admitting: Hematology and Oncology

## 2023-05-27 NOTE — Telephone Encounter (Signed)
Spoke with patient confirming upcoming appointment  

## 2023-06-16 ENCOUNTER — Ambulatory Visit: Payer: Medicare Other

## 2023-06-23 ENCOUNTER — Other Ambulatory Visit: Payer: Self-pay

## 2023-06-23 ENCOUNTER — Ambulatory Visit: Payer: Medicare Other | Attending: Hematology and Oncology

## 2023-06-23 DIAGNOSIS — M6281 Muscle weakness (generalized): Secondary | ICD-10-CM | POA: Diagnosis present

## 2023-06-23 DIAGNOSIS — Z9181 History of falling: Secondary | ICD-10-CM | POA: Diagnosis not present

## 2023-06-23 DIAGNOSIS — R262 Difficulty in walking, not elsewhere classified: Secondary | ICD-10-CM | POA: Diagnosis not present
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
## 2023-06-30 ENCOUNTER — Ambulatory Visit: Payer: Medicare Other | Admitting: Physical Therapy

## 2023-07-07 ENCOUNTER — Ambulatory Visit: Payer: Medicare Other

## 2023-07-14 ENCOUNTER — Ambulatory Visit: Payer: Medicare Other

## 2023-07-14 ENCOUNTER — Ambulatory Visit
Admission: RE | Admit: 2023-07-14 | Discharge: 2023-07-14 | Disposition: A | Discharge status: Home / Self Care | Payer: Medicare Other | Source: Ambulatory Visit | Attending: Internal Medicine | Admitting: Internal Medicine

## 2023-07-14 DIAGNOSIS — F17200 Nicotine dependence, unspecified, uncomplicated: Secondary | ICD-10-CM

## 2023-07-21 ENCOUNTER — Ambulatory Visit: Payer: Medicare Other

## 2023-07-21 ENCOUNTER — Encounter: Payer: Self-pay | Admitting: Hematology and Oncology

## 2023-07-21 ENCOUNTER — Other Ambulatory Visit: Payer: Self-pay

## 2023-07-21 DIAGNOSIS — W19XXXA Unspecified fall, initial encounter: Secondary | ICD-10-CM

## 2023-08-18 ENCOUNTER — Ambulatory Visit: Payer: Medicare Other | Admitting: Physical Therapy

## 2023-08-25 ENCOUNTER — Ambulatory Visit: Payer: Medicare Other

## 2023-09-01 ENCOUNTER — Ambulatory Visit: Payer: Medicare Other | Attending: Hematology and Oncology

## 2023-09-01 DIAGNOSIS — R262 Difficulty in walking, not elsewhere classified: Secondary | ICD-10-CM | POA: Diagnosis present

## 2023-09-01 DIAGNOSIS — M6281 Muscle weakness (generalized): Secondary | ICD-10-CM | POA: Insufficient documentation
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
## 2023-09-01 NOTE — Addendum Note (Signed)
Addended by: Mauri Reading on: 09/01/2023 04:05 PM   Modules accepted: Orders

## 2023-09-04 ENCOUNTER — Other Ambulatory Visit (HOSPITAL_COMMUNITY): Payer: Self-pay | Admitting: *Deleted

## 2023-09-08 ENCOUNTER — Inpatient Hospital Stay (HOSPITAL_COMMUNITY): Admission: RE | Admit: 2023-09-08 | Payer: Medicare Other | Source: Ambulatory Visit

## 2023-09-15 ENCOUNTER — Ambulatory Visit: Payer: Medicare Other

## 2023-09-22 ENCOUNTER — Ambulatory Visit: Payer: Medicare Other
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
## 2023-09-29 ENCOUNTER — Ambulatory Visit: Payer: Medicare Other | Attending: Hematology and Oncology

## 2023-09-29 DIAGNOSIS — M6281 Muscle weakness (generalized): Secondary | ICD-10-CM | POA: Insufficient documentation

## 2023-09-29 DIAGNOSIS — R262 Difficulty in walking, not elsewhere classified: Secondary | ICD-10-CM | POA: Insufficient documentation

## 2023-10-06 ENCOUNTER — Ambulatory Visit: Payer: Medicare Other

## 2023-10-06 DIAGNOSIS — M6281 Muscle weakness (generalized): Secondary | ICD-10-CM

## 2023-10-06 DIAGNOSIS — R262 Difficulty in walking, not elsewhere classified: Secondary | ICD-10-CM

## 2023-10-06 NOTE — Therapy (Signed)
OUTPATIENT PHYSICAL THERAPY TREATMENT NOTE   Patient Name: Debbie Black MRN: [id number] DOB:[birth year], 67 y.o., female Today's Date: 10/06/2023   END OF SESSION:  PT End of Session - 10/06/23 1301     Visit Number 4    Number of Visits 13    Date for PT Re-Evaluation 10/17/23    Authorization Type [employer] MCR    PT Start Time 1300    PT Stop Time 1338    PT Time Calculation (min) 38 min    Activity Tolerance Patient limited by fatigue    Behavior During Therapy Anxious;WFL for tasks assessed/performed              Past Medical History:  Diagnosis Date   Hypertension    MGUS (monoclonal gammopathy of unknown significance)    MGUS (monoclonal gammopathy of unknown significance)    Past Surgical History:  Procedure Laterality Date   BONE MARROW BIOPSY     BREAST BIOPSY     left heel surgery     Patient Active Problem List   Diagnosis Date Noted   Fall 05/26/2023   Chronic fatigue 05/09/2022   Chronic musculoskeletal pain 05/09/2021   Osteoporosis 05/09/2021   Macrocytosis without anemia 05/01/2020   Traumatic closed nondisplaced fracture of proximal end of left humerus 05/01/2020   MGUS (monoclonal gammopathy of unknown significance) 04/07/2013   Dental caries 04/07/2013   Stye 04/07/2013    PCP: Rodrigo Ran, MD  REFERRING PROVIDER: Artis Delay, MD  REFERRING DIAG: Fall, initial encounter [email or web address]]   THERAPY DIAG:  Difficulty in walking, not elsewhere classified  Muscle weakness (generalized)  Rationale for Evaluation and Treatment: Rehabilitation  ONSET DATE: August 2021 (first reported fall)   SUBJECTIVE:   SUBJECTIVE STATEMENT: Patient reports that she fell earlier today when walking up an incline. She does not endorse any particular injuries or pain from this fall.   PERTINENT HISTORY: PMHx includes chronic fatigue, osteoporosis, falls, HTN  ALLERGIC TO ADHESIVE/TAPE AND LATEX   PAIN:  Are you having pain? No   PRECAUTIONS:  None  WEIGHT BEARING RESTRICTIONS: No  FALLS:  Has patient fallen in last 6 months? Yes. Number of falls 1x in April 2024  LIVING ENVIRONMENT: Lives with: lives alone Lives in: House/apartment Stairs: No Has following equipment at home: None; "I sponge bath, bc they won't let me put grab bars in the shower where I live bc of how old the building is."   OCCUPATION: Part-time [employer] call center   PLOF: Independent  PATIENT GOALS: "I would like to know what's causing it."   NEXT MD VISIT: 05/31/24  OBJECTIVE:   DIAGNOSTIC FINDINGS:   09/16/2022 DG Foot Complete Right  FINDINGS: Minimal degenerative osteophytosis at the dorsal aspect of the talar neck. Joint spaces are preserved. No acute fracture or dislocation.  10/07/2019 no significant findings from hip and pelvic imaging    IMPRESSION: No significant osteoarthritis.  No acute fracture.  PATIENT SURVEYS:  FOTO 52 current, 52 predicted as of 09/01/23  COGNITION: Overall cognitive status: Within functional limits for tasks assessed     SENSATION: Not tested  POSTURE: rounded shoulders, forward head, and flexed trunk   LOWER EXTREMITY MMT:  MMT Right eval Left eval  Hip flexion 4- 4  Hip extension    Hip abduction 4 4  Hip adduction 4 4  Hip internal rotation    Hip external rotation    Knee flexion 5 5  Knee extension 5 5  Ankle dorsiflexion 4- 3+  Ankle plantarflexion 4- 4  Ankle inversion 4- 4  Ankle eversion 4- 4   (Blank rows = not tested)   Static Balance Tests   Balance  Eval  09/01/23  Romberg EO on floor  30 sec  No sway, however patient reports perceived swaying 30 sec  Romberg EC on floor 30 sec  Moderate fwd/backward swaying, however patient reports perceived Lt/Rt swaying  20 sec  Romberg EO on airex N/t 10 sec  Romberg EC on airex        N/t     10 sec   Tandem Walking Unable to perform  N/t      (N/t = not tested)    Balance Right Eval Left Eval   Right 09/01/23 Left 09/01/23   Tandem on floor 20 sec 20 sec Unable  7 sec  SLS on floor  N/t N/t N/t N/t     (N/t = not tested)     FUNCTIONAL TESTS:  Timed up and go (TUG): 7 sec Dynamic Gait Index: not assessed  Functional gait assessment: not assessed   GAIT: Distance walked: 71ft Assistive device utilized: None Level of assistance: Complete Independence Comments: slow gait speed, wide BOS, trace trunk and hip rotation bilaterally   TODAY'S TREATMENT:   [facility] Adult PT Treatment:                                                DATE: 10/06/23 Therapeutic Exercise: @ // bars  Standing hip abduction, 2 x 10 from 4" step  Step ups with airex on top x10 Bil fwd Hurdle (3) navigation, 4" step, airex pad x 3 laps  Heel toe raises on floor  2x10 Marching on Airex 2x30" Neuromuscular re-ed: @ //bars  Feet together on airex Hold, 2 x 30 sec eyes open Hold, 2 x 20 sec eyes closed  Head turns x 10 eyes open  Head nods x 10 eyes open    [facility] Adult PT Treatment:                                                DATE: 09/29/2023  Therapeutic Exercise: @ // bars  Standing hip abduction, 2 x 10 from 4" step  Hurdle (3) navigation, 4" step, airex pad x 3 laps  Heel toe raises on floor   Neuromuscular re-ed: @ //bars  Feet together on airex Hold, 2 x 30 sec eyes open Hold, 2 x 20 sec eyes closed  Head turns x 10 eyes open  Head nods x 10 eyes open Feet apart on airex  Hold, 2 x 30 sec eyes closed   Therapeutic Activity:  LLD assessment and related patient education regarding likelihood of true leg length discrepancy Updated HEP and reviewed; stressed importance of compliance of HEP and overall PT attendance.     [facility] Adult PT Treatment:                                                DATE: 09/01/2023   Therapeutic Activity:  Reassessment of objective measures and subjective assessment regarding progress towards established  goals and plan for independence extended POC date.  Patient education as noted below.                                                                                                       PATIENT EDUCATION:  Education details: extended patient education time regarding contributing systems and strategies to balance maintenance and recovery; discussed exam findings and connection with overall deficits and history of falls. Education regarding prognosis/rehab potential and overall POC.  Person educated: Patient Education method: Explanation, Demonstration, and Handouts Education comprehension: verbalized understanding, returned demonstration, and needs further education  HOME EXERCISE PROGRAM: To be provided at follow up     ASSESSMENT:  CLINICAL IMPRESSION: Patient presents to PT reporting that she fell this morning while walking up an incline, does not endorse any injuries from this. She also states that she has an ingrown toenail on her Lt foot which she feels like is affecting her balance. Session today continued to focus on balance tasks and LE strengthening. Patient continues to benefit from skilled PT services and should be progressed as able to improve functional independence.    OBJECTIVE IMPAIRMENTS: Abnormal gait, decreased activity tolerance, decreased balance, decreased knowledge of condition, decreased strength, decreased safety awareness, postural dysfunction, and decreased proprioceptive awareness .   ACTIVITY LIMITATIONS: carrying, stairs, and locomotion level  PARTICIPATION LIMITATIONS: cleaning, shopping, community activity, and ADLs/IADLs  PERSONAL FACTORS: Age, Past/current experiences, Time since onset of injury/illness/exacerbation, and 3+ comorbidities: PMHx includes chronic fatigue, osteoporosis, falls, HTN  are also affecting patient's functional outcome.   REHAB POTENTIAL: Fair    CLINICAL DECISION MAKING: Evolving/moderate complexity  EVALUATION COMPLEXITY: Moderate   GOALS: Goals reviewed with patient? Yes  SHORT TERM GOALS: Target date:  09/22/2023, updated as of 09/01/23   Patient will be independent with initial home program for LE strengthening.  Baseline: to be initiated at f/u session  Goal status: ONGOING  2.  Patient will be able to verbalize at least 2-3 strategies including home modifications and/or use of LRAD to decrease risk of future falls. Baseline: decreased understanding/awareness  Goal status: ONGOING    LONG TERM GOALS: Target date: 10/13/2023, updated as of 09/01/23   Patient will report improved overall functional ability with FOTO score increase of at least 7 points.  Baseline: see objective findings  Goal status: DISCONTINUED   2.  Patient will improve functional gait assessment by 1-2x MCID in order to demonstrate improved dynamic balance and overall safe function.  Baseline: to be assessed at f/u session.  Goal status: INITIAL   3.  Patient will demonstrate 4+/5 MMT or greater LE MMT Baseline: see objective findings  Goal status: ONGOING   4.  Patient will demonstrate ability to maintain tandem stance for at least 30 seconds bilaterally to demonstrate improved navigation of narrow spaces.  Baseline: see objective findings  Goal status: ONGOING   5.  Patient will demonstrate ability to navigate 6" step x 15 without LOB and with reported minimal-to-no fear of falling.  Baseline: unable and significant fear of falling  Goal status: INITIAL  PLAN:  PT FREQUENCY: 1-2x/week  PT DURATION: 6 weeks  PLANNED INTERVENTIONS: Therapeutic exercises, Therapeutic activity, Neuromuscular re-education, Balance training, Gait training, Patient/Family education, Self Care, Joint mobilization, Stair training, Vestibular training, Canalith repositioning, Dry Needling, Electrical stimulation, Spinal mobilization, Manual therapy, and Re-evaluation  PLAN FOR NEXT SESSION: FGA or DGI at next visit; begin static to dynamic balance retraining progression; begin LE strengthening, static to dynamic balance  progression   Berta Minor, PTA 10/06/2023, 1:42 PM

## 2023-10-13 ENCOUNTER — Ambulatory Visit: Payer: Medicare Other

## 2023-10-13 DIAGNOSIS — M6281 Muscle weakness (generalized): Secondary | ICD-10-CM

## 2023-10-13 DIAGNOSIS — R262 Difficulty in walking, not elsewhere classified: Secondary | ICD-10-CM

## 2023-10-13 NOTE — Therapy (Signed)
OUTPATIENT PHYSICAL THERAPY  NOTE   Patient Name: Debbie Black MRN: [id number] DOB:08-02-1956, 67 y.o., female Today's Date: 10/13/2023  PHYSICAL THERAPY DISCHARGE SUMMARY  Visits from Start of Care: 5  Current functional level related to goals / functional outcomes: See objective findings/assessment    Remaining deficits: See objective findings/assessment    Education / Equipment: See today's treatment/assessment      Patient agrees to discharge. Patient goals were partially met. Patient is being discharged due to the patient's request.    END OF SESSION:  PT End of Session - 10/13/23 1359     Visit Number 5    Number of Visits 13    Date for PT Re-Evaluation 10/17/23    Authorization Type [employer] MCR    PT Start Time 1400    PT Stop Time 1444    PT Time Calculation (min) 44 min    Behavior During Therapy WFL for tasks assessed/performed               Past Medical History:  Diagnosis Date   Hypertension    MGUS (monoclonal gammopathy of unknown significance)    MGUS (monoclonal gammopathy of unknown significance)    Past Surgical History:  Procedure Laterality Date   BONE MARROW BIOPSY     BREAST BIOPSY     left heel surgery     Patient Active Problem List   Diagnosis Date Noted   Fall 05/26/2023   Chronic fatigue 05/09/2022   Chronic musculoskeletal pain 05/09/2021   Osteoporosis 05/09/2021   Macrocytosis without anemia 05/01/2020   Traumatic closed nondisplaced fracture of proximal end of left humerus 05/01/2020   MGUS (monoclonal gammopathy of unknown significance) 04/07/2013   Dental caries 04/07/2013   Stye 04/07/2013    PCP: Rodrigo Ran, MD  REFERRING PROVIDER: Artis Delay, MD  REFERRING DIAG: Fall, initial encounter [email or web address]   THERAPY DIAG:  Difficulty in walking, not elsewhere classified  Muscle weakness (generalized)  Rationale for Evaluation and Treatment: Rehabilitation  ONSET DATE: August 2021 (first reported fall)    SUBJECTIVE:   SUBJECTIVE STATEMENT:  Patient endorses that her fall last week was related to walking up an incline.  This continues to be the source of her falls.  She is otherwise doing well with her balance and strengthening activities.  She would like to be discharged from skilled physical therapy at this time and intends to resume next year if necessary.  PERTINENT HISTORY: PMHx includes chronic fatigue, osteoporosis, falls, HTN  ALLERGIC TO ADHESIVE/TAPE AND LATEX   PAIN:  Are you having pain? No   PRECAUTIONS: None  WEIGHT BEARING RESTRICTIONS: No  FALLS:  Has patient fallen in last 6 months? Yes. Number of falls 1x in April 2024  LIVING ENVIRONMENT: Lives with: lives alone Lives in: House/apartment Stairs: No Has following equipment at home: None; "I sponge bath, bc they won't let me put grab bars in the shower where I live bc of how old the building is."   OCCUPATION: Part-time [employer] call center   PLOF: Independent  PATIENT GOALS: "I would like to know what's causing it."   NEXT MD VISIT: 05/31/24  OBJECTIVE:   DIAGNOSTIC FINDINGS:   09/16/2022 DG Foot Complete Right  FINDINGS: Minimal degenerative osteophytosis at the dorsal aspect of the talar neck. Joint spaces are preserved. No acute fracture or dislocation.  10/07/2019 no significant findings from hip and pelvic imaging    IMPRESSION: No significant osteoarthritis.  No acute fracture.  PATIENT SURVEYS:  FOTO  52 current, 52 predicted as of 09/01/23  COGNITION: Overall cognitive status: Within functional limits for tasks assessed     SENSATION: Not tested  POSTURE: rounded shoulders, forward head, and flexed trunk   LOWER EXTREMITY MMT:  MMT Right eval Left eval  Hip flexion 4- 4  Hip extension    Hip abduction 4 4  Hip adduction 4 4  Hip internal rotation    Hip external rotation    Knee flexion 5 5  Knee extension 5 5  Ankle dorsiflexion 4- 3+  Ankle plantarflexion 4- 4  Ankle  inversion 4- 4  Ankle eversion 4- 4   (Blank rows = not tested)   Static Balance Tests   Balance  Eval  09/01/23 10/13/23  Romberg EO on floor  30 sec  No sway, however patient reports perceived swaying 30 sec   Romberg EC on floor 30 sec  Moderate fwd/backward swaying, however patient reports perceived Lt/Rt swaying  20 sec 30 sec   Romberg EO on airex N/t 10 sec 30 sec   Romberg EC on airex        N/t     10 sec        30 sec   Tandem Walking Unable to perform  N/t   3 in a row     (N/t = not tested)    Balance Right Eval Left Eval   Right 09/01/23 Left 09/01/23 Right 10/13/23 Left 10/13/23  Tandem on floor 20 sec 20 sec Unable  7 sec 25 sec 30 sec   SLS on floor  N/t N/t N/t N/t < 5 sec 5 sec     (N/t = not tested)     FUNCTIONAL TESTS:  Timed up and go (TUG): 7 sec Dynamic Gait Index: not assessed  Functional gait assessment: not assessed   GAIT: Distance walked: 90ft Assistive device utilized: None Level of assistance: Complete Independence Comments: slow gait speed, wide BOS, trace trunk and hip rotation bilaterally   TODAY'S TREATMENT:    [facility] Adult PT Treatment:                                                DATE: 10/13/2023   Therapeutic Exercise:  Heel toe raises on floor  2x10 Recumbent bike x and self selected pace; with patient education for safe use and progression following d/c from PT   Therapeutic Activity:  Reassessment of objective measures and subjective assessment regarding progress towards established goals and plan for independence with prescribed home program following discharged from PT  Obstacle Course @ //bars x 2 laps fwd, x 2 laps lateral  Hurdles step over x 3  Foam balance beam   [facility] Adult PT Treatment:                                                DATE: 10/06/23  Therapeutic Exercise: @ // bars  Standing hip abduction, 2 x 10 from 4" step  Step ups with airex on top x10 Bil fwd Hurdle (3) navigation, 4" step, airex pad  x 3 laps  Heel toe raises on floor  2x10 Marching on Airex 2x30"  Neuromuscular re-ed: @ //bars  Feet together on airex Hold,  2 x 30 sec eyes open Hold, 2 x 20 sec eyes closed  Head turns x 10 eyes open  Head nods x 10 eyes open    [facility] Adult PT Treatment:                                                DATE: 09/29/2023  Therapeutic Exercise: @ // bars  Standing hip abduction, 2 x 10 from 4" step  Hurdle (3) navigation, 4" step, airex pad x 3 laps  Heel toe raises on floor   Neuromuscular re-ed: @ //bars  Feet together on airex Hold, 2 x 30 sec eyes open Hold, 2 x 20 sec eyes closed  Head turns x 10 eyes open  Head nods x 10 eyes open Feet apart on airex  Hold, 2 x 30 sec eyes closed   Therapeutic Activity:  LLD assessment and related patient education regarding likelihood of true leg length discrepancy Updated HEP and reviewed; stressed importance of compliance of HEP and overall PT attendance.                                                                                                        PATIENT EDUCATION:  Education details: extended patient education time regarding contributing systems and strategies to balance maintenance and recovery; discussed exam findings and connection with overall deficits and history of falls. Education regarding prognosis/rehab potential and overall POC.  Person educated: Patient Education method: Explanation, Demonstration, and Handouts Education comprehension: verbalized understanding, returned demonstration, and needs further education  HOME EXERCISE PROGRAM: Access Code: [email or web address] URL: [email or web address] Date: 10/13/2023 Prepared by: Mauri Reading  Exercises - Heel Toe Raises with Counter Support  - 1 x daily - 3-4 x weekly - 2 sets - 10 reps - Standing Hip Abduction with Counter Support  - 1 x daily - 3-4 x weekly - 2 sets - 10 reps - Standing Hip Extension with Counter Support  - 1 x daily - 3-4 x weekly - 2  sets - 10 reps - Lateral Step Up with Counter Support  - 1 x daily - 3-4 x weekly - 2 sets - 10 reps - 3 sec hold - Sit to Stand with Armchair  - 1 x daily - 3-4 x weekly - 2 sets - 10 reps    ASSESSMENT:  CLINICAL IMPRESSION: Luverna has attended 5 total PT sessions related to falls and balance deficits.  She has demonstrated improvement with static standing balance test, and continues to have limitations with dynamic balance and reactionary balance including navigation of hills and uneven surfaces.  She would benefit from ongoing skilled physical therapy to address these deficits, improve lower extremity strength/endurance, and maximize overall safe and independent function.  However patient would like to be discharged from skilled physical therapy at this time with independent home exercise program.  Encourage patient to pursue local gym membership for ongoing mobility and strengthening activities including recumbent bike.  She will be discharged at this time and is expected to follow-up with physical therapy should her symptoms or risk/frequency of falls increase.    OBJECTIVE IMPAIRMENTS: Abnormal gait, decreased activity tolerance, decreased balance, decreased knowledge of condition, decreased strength, decreased safety awareness, postural dysfunction, and decreased proprioceptive awareness .   ACTIVITY LIMITATIONS: carrying, stairs, and locomotion level  PARTICIPATION LIMITATIONS: cleaning, shopping, community activity, and ADLs/IADLs  PERSONAL FACTORS: Age, Past/current experiences, Time since onset of injury/illness/exacerbation, and 3+ comorbidities: PMHx includes chronic fatigue, osteoporosis, falls, HTN  are also affecting patient's functional outcome.   REHAB POTENTIAL: Fair    CLINICAL DECISION MAKING: Evolving/moderate complexity  EVALUATION COMPLEXITY: Moderate   GOALS: Goals reviewed with patient? Yes  SHORT TERM GOALS: Target date: 09/22/2023, updated as of  09/01/23   Patient will be independent with initial home program for LE strengthening.  Baseline: to be initiated at f/u session  Goal status: MET  2.  Patient will be able to verbalize at least 2-3 strategies including home modifications and/or use of LRAD to decrease risk of future falls. Baseline: decreased understanding/awareness  Goal status: MET    LONG TERM GOALS: Target date: 10/13/2023, updated as of 09/01/23   Patient will report improved overall functional ability with FOTO score increase of at least 7 points.  Baseline: see objective findings  Goal status: DISCONTINUED   2.  Patient will improve functional gait assessment by 1-2x MCID in order to demonstrate improved dynamic balance and overall safe function.  Baseline: to be assessed at f/u session.  Goal status: DISCONTINUED   3.  Patient will demonstrate 4+/5 MMT or greater LE MMT Baseline: see objective findings  Goal status: Not assessed at date of discharge  4.  Patient will demonstrate ability to maintain tandem stance for at least 30 seconds bilaterally to demonstrate improved navigation of narrow spaces.  Baseline: see objective findings  Goal status: Partially met Balance Right Eval Left Eval   Right 09/01/23 Left 09/01/23 Right 10/13/23 Left 10/13/23  Tandem on floor 20 sec 20 sec Unable  7 sec 25 sec 30 sec     5.  Patient will demonstrate ability to navigate 6" step x 15 without LOB and with reported minimal-to-no fear of falling.  Baseline: unable and significant fear of falling  Goal status: Ongoing   PLAN:  PT FREQUENCY: 1-2x/week  PT DURATION: 6 weeks  PLANNED INTERVENTIONS: Therapeutic exercises, Therapeutic activity, Neuromuscular re-education, Balance training, Gait training, Patient/Family education, Self Care, Joint mobilization, Stair training, Vestibular training, Canalith repositioning, Dry Needling, Electrical stimulation, Spinal mobilization, Manual therapy, and  Re-evaluation    Mauri Reading, PT, DPT   10/13/2023, 7:09 PM

## 2023-11-18 ENCOUNTER — Other Ambulatory Visit: Payer: Self-pay | Admitting: Internal Medicine

## 2023-11-18 DIAGNOSIS — Z1231 Encounter for screening mammogram for malignant neoplasm of breast: Secondary | ICD-10-CM

## 2023-12-25 ENCOUNTER — Ambulatory Visit
Admission: RE | Admit: 2023-12-25 | Discharge: 2023-12-25 | Disposition: A | Discharge status: Home / Self Care | Payer: Medicare Other | Source: Ambulatory Visit | Attending: Internal Medicine | Admitting: Internal Medicine

## 2023-12-25 DIAGNOSIS — Z1231 Encounter for screening mammogram for malignant neoplasm of breast: Secondary | ICD-10-CM

## 2024-01-01 ENCOUNTER — Other Ambulatory Visit: Payer: Self-pay | Admitting: Internal Medicine

## 2024-01-01 DIAGNOSIS — R928 Other abnormal and inconclusive findings on diagnostic imaging of breast: Secondary | ICD-10-CM

## 2024-01-12 ENCOUNTER — Ambulatory Visit
Admission: RE | Admit: 2024-01-12 | Discharge: 2024-01-12 | Disposition: A | Discharge status: Home / Self Care | Payer: Medicare Other | Source: Ambulatory Visit | Attending: Internal Medicine | Admitting: Internal Medicine

## 2024-01-12 ENCOUNTER — Other Ambulatory Visit: Payer: Self-pay | Admitting: Internal Medicine

## 2024-01-12 DIAGNOSIS — R928 Other abnormal and inconclusive findings on diagnostic imaging of breast: Secondary | ICD-10-CM

## 2024-01-12 DIAGNOSIS — N6320 Unspecified lump in the left breast, unspecified quadrant: Secondary | ICD-10-CM

## 2024-01-12 DIAGNOSIS — N6321 Unspecified lump in the left breast, upper outer quadrant: Secondary | ICD-10-CM | POA: Diagnosis not present

## 2024-01-21 DIAGNOSIS — I10 Essential (primary) hypertension: Secondary | ICD-10-CM | POA: Diagnosis not present

## 2024-01-21 DIAGNOSIS — J449 Chronic obstructive pulmonary disease, unspecified: Secondary | ICD-10-CM | POA: Diagnosis not present

## 2024-01-21 DIAGNOSIS — Z23 Encounter for immunization: Secondary | ICD-10-CM | POA: Diagnosis not present

## 2024-01-26 ENCOUNTER — Other Ambulatory Visit: Payer: Self-pay | Admitting: Internal Medicine

## 2024-01-26 ENCOUNTER — Ambulatory Visit
Admission: RE | Admit: 2024-01-26 | Discharge: 2024-01-26 | Disposition: A | Discharge status: Home / Self Care | Payer: Medicare Other | Source: Ambulatory Visit | Attending: Internal Medicine | Admitting: Internal Medicine

## 2024-01-26 DIAGNOSIS — N6320 Unspecified lump in the left breast, unspecified quadrant: Secondary | ICD-10-CM

## 2024-01-26 DIAGNOSIS — N6489 Other specified disorders of breast: Secondary | ICD-10-CM

## 2024-01-26 DIAGNOSIS — N6321 Unspecified lump in the left breast, upper outer quadrant: Secondary | ICD-10-CM | POA: Diagnosis not present

## 2024-01-26 DIAGNOSIS — N6022 Fibroadenosis of left breast: Secondary | ICD-10-CM | POA: Diagnosis not present

## 2024-01-26 HISTORY — PX: BREAST BIOPSY: SHX20

## 2024-01-27 DIAGNOSIS — H938X2 Other specified disorders of left ear: Secondary | ICD-10-CM | POA: Diagnosis not present

## 2024-01-27 DIAGNOSIS — J069 Acute upper respiratory infection, unspecified: Secondary | ICD-10-CM | POA: Diagnosis not present

## 2024-01-27 LAB — SURGICAL PATHOLOGY

## 2024-02-02 ENCOUNTER — Ambulatory Visit
Admission: RE | Admit: 2024-02-02 | Discharge: 2024-02-02 | Disposition: A | Discharge status: Home / Self Care | Payer: Medicare Other | Source: Ambulatory Visit | Attending: Internal Medicine | Admitting: Internal Medicine

## 2024-02-02 DIAGNOSIS — N6022 Fibroadenosis of left breast: Secondary | ICD-10-CM | POA: Diagnosis not present

## 2024-02-02 DIAGNOSIS — N6489 Other specified disorders of breast: Secondary | ICD-10-CM

## 2024-02-02 HISTORY — PX: BREAST BIOPSY: SHX20

## 2024-02-04 LAB — SURGICAL PATHOLOGY

## 2024-02-11 DIAGNOSIS — L853 Xerosis cutis: Secondary | ICD-10-CM | POA: Diagnosis not present

## 2024-04-11 DIAGNOSIS — M791 Myalgia, unspecified site: Secondary | ICD-10-CM | POA: Diagnosis not present

## 2024-04-11 DIAGNOSIS — M545 Low back pain, unspecified: Secondary | ICD-10-CM | POA: Diagnosis not present

## 2024-04-22 DIAGNOSIS — I10 Essential (primary) hypertension: Secondary | ICD-10-CM | POA: Diagnosis not present

## 2024-04-22 DIAGNOSIS — L853 Xerosis cutis: Secondary | ICD-10-CM | POA: Diagnosis not present

## 2024-05-07 ENCOUNTER — Encounter: Payer: Self-pay | Admitting: Hematology and Oncology

## 2024-05-20 ENCOUNTER — Inpatient Hospital Stay: Payer: Medicare Other | Attending: Internal Medicine

## 2024-05-20 DIAGNOSIS — M810 Age-related osteoporosis without current pathological fracture: Secondary | ICD-10-CM

## 2024-05-20 DIAGNOSIS — G8929 Other chronic pain: Secondary | ICD-10-CM

## 2024-05-20 DIAGNOSIS — D472 Monoclonal gammopathy: Secondary | ICD-10-CM | POA: Diagnosis present

## 2024-05-20 LAB — COMPREHENSIVE METABOLIC PANEL WITH GFR
ALT: 17 U/L (ref 0–44)
AST: 16 U/L (ref 15–41)
Albumin: 4.2 g/dL (ref 3.5–5.0)
Alkaline Phosphatase: 68 U/L (ref 38–126)
Anion gap: 8 (ref 5–15)
BUN: 9 mg/dL (ref 8–23)
CO2: 26 mmol/L (ref 22–32)
Calcium: 9.3 mg/dL (ref 8.9–10.3)
Chloride: 96 mmol/L — ABNORMAL LOW (ref 98–111)
Creatinine, Ser: 0.63 mg/dL (ref 0.44–1.00)
GFR, Estimated: >60 mL/min (ref >60)
Glucose, Bld: 93 mg/dL (ref 70–99)
Potassium: 4.1 mmol/L (ref 3.5–5.1)
Sodium: 130 mmol/L — ABNORMAL LOW (ref 135–145)
Total Bilirubin: 0.3 mg/dL (ref 0.0–1.2)
Total Protein: 7.5 g/dL (ref 6.5–8.1)

## 2024-05-20 LAB — CBC WITH DIFFERENTIAL/PLATELET
Abs Immature Granulocytes: 0.03 10*3/uL (ref 0.00–0.07)
Basophils Absolute: 0.1 10*3/uL (ref 0.0–0.1)
Basophils Relative: 2 %
Eosinophils Absolute: 0 10*3/uL (ref 0.0–0.5)
Eosinophils Relative: 0 %
HCT: 37.8 % (ref 36.0–46.0)
Hemoglobin: 12.8 g/dL (ref 12.0–15.0)
Immature Granulocytes: 0 %
Lymphocytes Relative: 18 %
Lymphs Abs: 1.3 10*3/uL (ref 0.7–4.0)
MCH: 30.8 pg (ref 26.0–34.0)
MCHC: 33.9 g/dL (ref 30.0–36.0)
MCV: 90.9 fL (ref 80.0–100.0)
Monocytes Absolute: 0.3 10*3/uL (ref 0.1–1.0)
Monocytes Relative: 4 %
Neutro Abs: 5.2 10*3/uL (ref 1.7–7.7)
Neutrophils Relative %: 76 %
Platelets: 263 10*3/uL (ref 150–400)
RBC: 4.16 MIL/uL (ref 3.87–5.11)
RDW: 14.2 % (ref 11.5–15.5)
WBC: 6.9 10*3/uL (ref 4.0–10.5)
nRBC: 0 % (ref 0.0–0.2)

## 2024-05-23 LAB — MULTIPLE MYELOMA PANEL, SERUM
Albumin SerPl Elph-Mcnc: 3.4 g/dL (ref 2.9–4.4)
Albumin/Glob SerPl: 0.9 (ref 0.7–1.7)
Alpha 1: 0.4 g/dL (ref 0.0–0.4)
Alpha2 Glob SerPl Elph-Mcnc: 1.1 g/dL — ABNORMAL HIGH (ref 0.4–1.0)
B-Globulin SerPl Elph-Mcnc: 1.1 g/dL (ref 0.7–1.3)
Gamma Glob SerPl Elph-Mcnc: 1.4 g/dL (ref 0.4–1.8)
Globulin, Total: 3.9 g/dL (ref 2.2–3.9)
IgA: 157 mg/dL (ref 87–352)
IgG (Immunoglobin G), Serum: 1184 mg/dL (ref 586–1602)
IgM (Immunoglobulin M), Srm: 199 mg/dL (ref 26–217)
M Protein SerPl Elph-Mcnc: 0.5 g/dL — ABNORMAL HIGH
Total Protein ELP: 7.3 g/dL (ref 6.0–8.5)

## 2024-05-24 LAB — KAPPA/LAMBDA LIGHT CHAINS
Kappa free light chain: 23.6 mg/L — ABNORMAL HIGH (ref 3.3–19.4)
Kappa, lambda light chain ratio: 2.38 — ABNORMAL HIGH (ref 0.26–1.65)
Lambda free light chains: 9.9 mg/L (ref 5.7–26.3)

## 2024-05-31 ENCOUNTER — Encounter: Payer: Self-pay | Admitting: Hematology and Oncology

## 2024-05-31 ENCOUNTER — Inpatient Hospital Stay: Payer: Medicare Other | Attending: Hematology and Oncology | Admitting: Hematology and Oncology

## 2024-05-31 VITALS — BP 150/75 | HR 67 | Temp 98.2°F | Resp 18 | Ht 60.0 in | Wt 129.2 lb

## 2024-05-31 DIAGNOSIS — D472 Monoclonal gammopathy: Secondary | ICD-10-CM | POA: Diagnosis present

## 2024-05-31 NOTE — Assessment & Plan Note (Addendum)
 We discussed the natural history of MGUS She is not symptomatic Myeloma panel was reviewed which showed no signs of progression I plan to continue to see her once a year

## 2024-05-31 NOTE — Progress Notes (Signed)
 [facility] OFFICE PROGRESS NOTE  Patient Care Team: Aldo Hun, MD as PCP - General (Internal Medicine)  ASSESSMENT & PLAN:  Assessment & Plan MGUS (monoclonal gammopathy of unknown significance) We discussed the natural history of MGUS She is not symptomatic Myeloma panel was reviewed which showed no signs of progression I plan to continue to see her once a year  No orders of the defined types were placed in this encounter.    INTERVAL HISTORY: she returns for surveillance follow-up for diagnosis of MGUS Patient denies recurrent infection or bone pain We reviewed recent CBC, CMP and myeloma panel results Since last time I saw her, she had recurrent falls She has seen orthopedic team as well as physical therapy  PHYSICAL EXAMINATION: ECOG PERFORMANCE STATUS: 0 - Asymptomatic  Vitals:   05/31/24 0919 05/31/24 0930  BP: (!) 171/78 (!) 150/75  Pulse: 67   Resp: 18   Temp: 98.2 F (36.8 C)   SpO2: 100%

## 2024-06-01 ENCOUNTER — Encounter: Payer: Self-pay | Admitting: Hematology and Oncology

## 2024-06-01 ENCOUNTER — Telehealth: Payer: Self-pay | Admitting: Hematology and Oncology

## 2024-07-29 DIAGNOSIS — L282 Other prurigo: Secondary | ICD-10-CM | POA: Diagnosis not present

## 2024-07-29 DIAGNOSIS — I10 Essential (primary) hypertension: Secondary | ICD-10-CM | POA: Diagnosis not present

## 2024-07-29 DIAGNOSIS — B029 Zoster without complications: Secondary | ICD-10-CM | POA: Diagnosis not present

## 2024-08-04 ENCOUNTER — Other Ambulatory Visit: Payer: Self-pay | Admitting: Internal Medicine

## 2024-08-04 DIAGNOSIS — I10 Essential (primary) hypertension: Secondary | ICD-10-CM | POA: Diagnosis not present

## 2024-08-04 DIAGNOSIS — F17200 Nicotine dependence, unspecified, uncomplicated: Secondary | ICD-10-CM | POA: Diagnosis not present

## 2024-08-04 DIAGNOSIS — K921 Melena: Secondary | ICD-10-CM | POA: Diagnosis not present

## 2024-08-04 DIAGNOSIS — I700 Atherosclerosis of aorta: Secondary | ICD-10-CM | POA: Diagnosis not present

## 2024-08-04 DIAGNOSIS — J449 Chronic obstructive pulmonary disease, unspecified: Secondary | ICD-10-CM | POA: Diagnosis not present

## 2024-08-04 DIAGNOSIS — R82998 Other abnormal findings in urine: Secondary | ICD-10-CM | POA: Diagnosis not present

## 2024-08-04 DIAGNOSIS — Z0000 Encounter for general adult medical examination without abnormal findings: Secondary | ICD-10-CM | POA: Diagnosis not present

## 2024-08-04 DIAGNOSIS — M810 Age-related osteoporosis without current pathological fracture: Secondary | ICD-10-CM | POA: Diagnosis not present

## 2024-08-04 DIAGNOSIS — E559 Vitamin D deficiency, unspecified: Secondary | ICD-10-CM | POA: Diagnosis not present

## 2024-08-04 DIAGNOSIS — Z1331 Encounter for screening for depression: Secondary | ICD-10-CM | POA: Diagnosis not present

## 2024-08-04 DIAGNOSIS — D472 Monoclonal gammopathy: Secondary | ICD-10-CM | POA: Diagnosis not present

## 2024-08-05 ENCOUNTER — Encounter: Payer: Self-pay | Admitting: Internal Medicine

## 2024-08-08 ENCOUNTER — Telehealth (HOSPITAL_COMMUNITY): Payer: Self-pay

## 2024-08-08 NOTE — Telephone Encounter (Signed)
 Auth Submission: NO AUTH NEEDED Site of care: Site of care: [facility] Payer: [employer] Medication & CPT/J Code(s) submitted: Reclast (Zolendronic acid) [id number] Diagnosis Code: M81.0 Route of submission (phone, fax, portal):  Phone # Fax # Auth type: Buy/Bill HB Units/visits requested: 5mg  x 1 dose Reference number:  Approval from: 08/08/24 to 12/28/24

## 2024-08-09 ENCOUNTER — Ambulatory Visit
Admission: RE | Admit: 2024-08-09 | Discharge: 2024-08-09 | Disposition: A | Discharge status: Home / Self Care | Source: Ambulatory Visit | Attending: Internal Medicine | Admitting: Internal Medicine

## 2024-08-09 DIAGNOSIS — F17200 Nicotine dependence, unspecified, uncomplicated: Secondary | ICD-10-CM

## 2024-08-09 DIAGNOSIS — Z122 Encounter for screening for malignant neoplasm of respiratory organs: Secondary | ICD-10-CM | POA: Diagnosis not present

## 2024-08-09 DIAGNOSIS — M62830 Muscle spasm of back: Secondary | ICD-10-CM | POA: Diagnosis not present

## 2024-08-09 DIAGNOSIS — R0781 Pleurodynia: Secondary | ICD-10-CM | POA: Diagnosis not present

## 2024-08-09 DIAGNOSIS — F17210 Nicotine dependence, cigarettes, uncomplicated: Secondary | ICD-10-CM | POA: Diagnosis not present

## 2024-08-16 ENCOUNTER — Other Ambulatory Visit (HOSPITAL_COMMUNITY): Payer: Self-pay | Admitting: *Deleted

## 2024-08-16 DIAGNOSIS — B029 Zoster without complications: Secondary | ICD-10-CM | POA: Diagnosis not present

## 2024-08-16 DIAGNOSIS — L299 Pruritus, unspecified: Secondary | ICD-10-CM | POA: Diagnosis not present

## 2024-08-16 DIAGNOSIS — W57XXXA Bitten or stung by nonvenomous insect and other nonvenomous arthropods, initial encounter: Secondary | ICD-10-CM | POA: Diagnosis not present

## 2024-08-16 DIAGNOSIS — I10 Essential (primary) hypertension: Secondary | ICD-10-CM | POA: Diagnosis not present

## 2024-08-17 ENCOUNTER — Ambulatory Visit (HOSPITAL_COMMUNITY)
Admission: RE | Admit: 2024-08-17 | Discharge: 2024-08-17 | Disposition: A | Discharge status: Home / Self Care | Source: Ambulatory Visit | Attending: Internal Medicine | Admitting: Internal Medicine

## 2024-08-17 DIAGNOSIS — M810 Age-related osteoporosis without current pathological fracture: Secondary | ICD-10-CM | POA: Insufficient documentation

## 2024-08-17 MED ORDER — ZOLEDRONIC ACID 5 MG/100ML IV SOLN
5.0000 mg | Freq: Once | INTRAVENOUS | Status: AC
  Administered 2024-08-17: 5 mg via INTRAVENOUS

## 2024-08-17 MED ORDER — ZOLEDRONIC ACID 5 MG/100ML IV SOLN
INTRAVENOUS | Status: AC
  Filled 2024-08-17: qty 100

## 2024-08-23 DIAGNOSIS — Z1152 Encounter for screening for COVID-19: Secondary | ICD-10-CM | POA: Diagnosis not present

## 2024-08-23 DIAGNOSIS — R5383 Other fatigue: Secondary | ICD-10-CM | POA: Diagnosis not present

## 2024-08-23 DIAGNOSIS — J069 Acute upper respiratory infection, unspecified: Secondary | ICD-10-CM | POA: Diagnosis not present

## 2024-08-23 DIAGNOSIS — H538 Other visual disturbances: Secondary | ICD-10-CM | POA: Diagnosis not present

## 2024-08-23 DIAGNOSIS — R059 Cough, unspecified: Secondary | ICD-10-CM | POA: Diagnosis not present

## 2024-08-23 DIAGNOSIS — J302 Other seasonal allergic rhinitis: Secondary | ICD-10-CM | POA: Diagnosis not present

## 2024-08-23 DIAGNOSIS — J3489 Other specified disorders of nose and nasal sinuses: Secondary | ICD-10-CM | POA: Diagnosis not present

## 2024-08-23 DIAGNOSIS — R6883 Chills (without fever): Secondary | ICD-10-CM | POA: Diagnosis not present

## 2024-08-31 ENCOUNTER — Other Ambulatory Visit (HOSPITAL_COMMUNITY): Payer: Self-pay | Admitting: Internal Medicine

## 2024-08-31 DIAGNOSIS — I272 Pulmonary hypertension, unspecified: Secondary | ICD-10-CM

## 2024-09-02 DIAGNOSIS — L299 Pruritus, unspecified: Secondary | ICD-10-CM | POA: Diagnosis not present

## 2024-09-02 DIAGNOSIS — J449 Chronic obstructive pulmonary disease, unspecified: Secondary | ICD-10-CM | POA: Diagnosis not present

## 2024-09-02 DIAGNOSIS — R21 Rash and other nonspecific skin eruption: Secondary | ICD-10-CM | POA: Diagnosis not present

## 2024-09-02 DIAGNOSIS — I10 Essential (primary) hypertension: Secondary | ICD-10-CM | POA: Diagnosis not present

## 2024-09-02 DIAGNOSIS — F17200 Nicotine dependence, unspecified, uncomplicated: Secondary | ICD-10-CM | POA: Diagnosis not present

## 2024-09-09 ENCOUNTER — Encounter: Payer: Self-pay | Admitting: Physician Assistant

## 2024-09-28 DIAGNOSIS — I10 Essential (primary) hypertension: Secondary | ICD-10-CM | POA: Diagnosis not present

## 2024-09-29 ENCOUNTER — Other Ambulatory Visit (HOSPITAL_COMMUNITY)

## 2024-10-13 ENCOUNTER — Other Ambulatory Visit: Payer: Self-pay

## 2024-10-13 ENCOUNTER — Ambulatory Visit: Admitting: Allergy

## 2024-10-13 ENCOUNTER — Encounter: Payer: Self-pay | Admitting: Allergy

## 2024-10-13 VITALS — BP 130/80 | HR 78 | Temp 97.5°F | Resp 16 | Ht 59.5 in | Wt 131.4 lb

## 2024-10-13 DIAGNOSIS — L509 Urticaria, unspecified: Secondary | ICD-10-CM | POA: Diagnosis not present

## 2024-10-13 DIAGNOSIS — J3089 Other allergic rhinitis: Secondary | ICD-10-CM | POA: Diagnosis not present

## 2024-10-13 DIAGNOSIS — J449 Chronic obstructive pulmonary disease, unspecified: Secondary | ICD-10-CM | POA: Diagnosis not present

## 2024-10-13 MED ORDER — IPRATROPIUM BROMIDE 0.06 % NA SOLN
NASAL | 5 refills | Status: AC
Start: 2024-10-13 — End: ?

## 2024-10-13 MED ORDER — FLUTICASONE-SALMETEROL 100-50 MCG/ACT IN AEPB: 1.0000 | INHALATION_SPRAY | Freq: Two times a day (BID) | RESPIRATORY_TRACT | 5 refills | Status: AC

## 2024-10-13 MED ORDER — TRIAMCINOLONE ACETONIDE 0.1 % EX CREA: 1.0000 | TOPICAL_CREAM | Freq: Two times a day (BID) | CUTANEOUS | 3 refills | Status: AC | PRN

## 2024-10-13 MED ORDER — ALBUTEROL SULFATE HFA 108 (90 BASE) MCG/ACT IN AERS: INHALATION_SPRAY | RESPIRATORY_TRACT | 1 refills | Status: AC

## 2024-10-13 NOTE — Patient Instructions (Signed)
 Recurrent papular urticaria (hives) Recurrent papular urticaria most likely due to insect bites.  Will rule-out other causes of urticaria.  - Order blood work for urticaria.  - Advise double dose of Claritin if rash recurs.  So can take 1 tab twice a day until hives have resolved.   - Continue triamcinolone  cream as needed for itchy rash. - Reserve Benadryl for severe pruritus. - Advise triamcinolone  post-insect bites and consider icing - Recommend Tylenol  for pain if needed.  Allergic rhinitis with multiple environmental allergies Allergic rhinitis with rhinorrhea and drainage, poorly controlled with Flonase . - Prescribe Atrovent nasal spray for runny nose.  Use 2 sprays each nostril up 4 times a day as needed for runny nose.  - Continue Flonase  daily for congestion. - Continue Claritin and Singulair daily for allergies  Chronic obstructive pulmonary disease (COPD) COPD well-managed with Advair - Continue Advair 1 puff twice daily. - Have access to albuterol inhaler 2 puffs every 4-6 hours as needed for cough/wheeze/shortness of breath/chest tightness.  May use 15-20 minutes prior to activity.   Monitor frequency of use.    Follow-up in 4-6 months or sooner if needed

## 2024-10-13 NOTE — Progress Notes (Signed)
 New Patient Note  RE: LOUAN BASE MRN: [id number] DOB: 10-31-1956 Date of Office Visit: 10/13/2024  Primary care provider: Shayne Anes, MD  Chief Complaint: rash  History of present illness: Debbie Black is a 68 y.o. female presenting today for evaluation of dermatitis.  Discussed the use of AI scribe software for clinical note transcription with the patient, who gave verbal consent to proceed.  She has been experiencing a recurrent rash that began after an episode of shingles in mid-July. The initial rash appeared around her belly and left leg, with subsequent episodes affecting her left leg and foot. The rash is itchy and painful, with red dots that persist for about two weeks before clearing. Triamcinolone  cream and Benadryl have been used, which eventually helped clear the rash.  She has a long-standing history of allergies, though she is not aware of all specific allergens. She is highly reactive to insect bites, particularly mosquitoes and ticks, which cause large, painful welts. She has been on antibiotics in the past to manage reactions to tick bites. Her current medications include Claritin, Singulair, and Flonase  for allergy management, and Advair for COPD. She takes Claritin daily and uses triamcinolone  cream for skin reactions. She avoids certain foods like MSG, spicy foods, and some vegetables due to adverse reactions.  She has a history of thyroid  issues, though her blood work has not consistently shown abnormalities. She experiences significant environmental allergies, reacting to perfumes, cleaning products, and mold. No recent fire ant bites. She experiences occasional breathing difficulties in cold, dry air. She does not currently have a rescue inhaler but manages with Advair twice daily.  Her PCP manages her COPD at this time.     She has history of MGUS followed by H/O yearly.  As of her last visit in June 2025 her myeloma panel had no signs of progression.    Review  of systems: 10pt ROS negative unless noted above in HPI  Past medical history: Past Medical History:  Diagnosis Date   Asthma    COPD (chronic obstructive pulmonary disease) (HCC)    Hypertension    MGUS (monoclonal gammopathy of unknown significance)    MGUS (monoclonal gammopathy of unknown significance)     Past surgical history: Past Surgical History:  Procedure Laterality Date   ADENOIDECTOMY     BONE MARROW BIOPSY     BREAST BIOPSY Right 01/02/2016   BREAST BIOPSY Left 01/26/2024   US  LT BREAST BX W LOC DEV 1ST LESION IMG BX SPEC US  GUIDE 01/26/2024 [facility]   BREAST BIOPSY Left 02/02/2024   MM LT BREAST BX W LOC DEV 1ST LESION IMAGE BX SPEC STEREO GUIDE 02/02/2024 [facility]   left heel surgery     TYMPANOSTOMY TUBE PLACEMENT      Family history:  Family History  Problem Relation Age of Onset   Angioedema Mother    Allergic rhinitis Mother    Eczema Father    Allergic rhinitis Father    Allergic rhinitis Brother    BRCA 1/2 Neg Hx    Breast cancer Neg Hx     Social history: Lives in a home without carpeting with electric heating and central cooling.  No pets in the home.  There is concern for water damage/mildew and roaches in the home.  She is a caregiver.   Tobacco Use   Smoking status: Heavy Smoker    Current packs/day: 1.00    Average packs/day: 1 pack/day for 35.0 years (35.0 ttl pk-yrs)  Types: Cigarettes   Smokeless tobacco: Never   Medication List: Current Outpatient Medications  Medication Sig Dispense Refill   escitalopram (LEXAPRO) 10 MG tablet Take 10 mg by mouth daily.     fluticasone -salmeterol (ADVAIR) 100-50 MCG/ACT AEPB Inhale 1 puff into the lungs 2 (two) times daily.     MYRBETRIQ 50 MG TB24 tablet Take 50 mg by mouth daily.     olmesartan (BENICAR) 20 MG tablet Take 20 mg by mouth daily.     rosuvastatin (CRESTOR) 5 MG tablet Take 10 mg by mouth daily.     SINGULAIR 10 MG tablet Take 5 mg by mouth daily.      acetaminophen  (TYLENOL ) 500 MG tablet Take 500 mg by mouth every 6 (six) hours as needed for headache. (Patient not taking: Reported on 10/13/2024)     fluticasone  (FLONASE ) 50 MCG/ACT nasal spray Place 2 sprays into both nostrils daily.     loratadine (CLARITIN) 10 MG tablet Take 10 mg by mouth daily.     omeprazole (PRILOSEC) 20 MG capsule Take 20 mg by mouth daily. (Patient not taking: Reported on 10/13/2024)     rizatriptan (MAXALT) 10 MG tablet Take 10 mg by mouth as needed for migraine. As needed (Patient not taking: Reported on 10/13/2024)     No current facility-administered medications for this visit.    Known medication allergies: Allergies  Allergen Reactions   Adhesive [Tape]    Codeine    Ibuprofen  Nausea Only   Latex    Nsaids Nausea And Vomiting     Physical examination: Blood pressure 130/80, pulse 78, temperature (!) 97.5 F (36.4 C), resp. rate 16, height 4' 11.5 (1.511 m), weight 131 lb 6.4 oz (59.6 kg), SpO2 96%.  General: Alert, interactive, in no acute distress. HEENT: PERRLA, TMs pearly gray, turbinates non-edematous without discharge, post-pharynx non erythematous. Neck: Supple without lymphadenopathy. Lungs: Clear to auscultation without wheezing, rhonchi or rales. {no increased work of breathing. CV: Normal S1, S2 without murmurs. Abdomen: Nondistended, nontender. Skin: Warm and dry, without lesions or rashes. Extremities:  No clubbing, cyanosis or edema. Neuro:   Grossly intact.  Diagnostics/Labs: Component     Latest Ref Rng 05/20/2024  WBC     4.0 - 10.5 K/uL 6.9   RBC     3.87 - 5.11 MIL/uL 4.16   Hemoglobin     12.0 - 15.0 g/dL 87.1   HCT     63.9 - 53.9 % 37.8   MCV     80.0 - 100.0 fL 90.9   MCH     26.0 - 34.0 pg 30.8   MCHC     30.0 - 36.0 g/dL 66.0   RDW     88.4 - 84.4 % 14.2   Platelets     150 - 400 K/uL 263   nRBC     0.0 - 0.2 % 0.0   Neutrophils     % 76   NEUT#     1.7 - 7.7 K/uL 5.2   Lymphocytes     % 18    Lymphs Abs     0.7 - 4.0 K/uL 1.3   Monocytes Relative     % 4   Monocyte #     0.1 - 1.0 K/uL 0.3   Eosinophil     % 0   Eosinophils Absolute     0.0 - 0.5 K/uL 0.0   Basophil     % 2   Basophils Absolute  0.0 - 0.1 K/uL 0.1   Immature Granulocytes     % 0   Abs Immature Granulocytes     0.00 - 0.07 K/uL 0.03   Sodium     135 - 145 mmol/L 130 (L)   Potassium     3.5 - 5.1 mmol/L 4.1   Chloride     98 - 111 mmol/L 96 (L)   CO2     22 - 32 mmol/L 26   Glucose     70 - 99 mg/dL 93   BUN     8 - 23 mg/dL 9   Creatinine     9.55 - 1.00 mg/dL 9.36   Calcium     8.9 - 10.3 mg/dL 9.3   Total Protein     6.5 - 8.1 g/dL 7.5   Albumin     3.5 - 5.0 g/dL 4.2   AST     15 - 41 U/L 16   ALT     0 - 44 U/L 17   Alkaline Phosphatase     38 - 126 U/L 68   Total Bilirubin     0.0 - 1.2 mg/dL 0.3   GFR, Estimated     >60 mL/min >60   Anion gap     5 - 15  8   IgG (Immunoglobin G), Serum     586 - 1,602 mg/dL 8,815   IgA     87 - 647 mg/dL 842   IgM (Immunoglobulin M), Srm     26 - 217 mg/dL 800   Total Protein ELP     6.0 - 8.5 g/dL 7.3 (C)  Albumin SerPl Elph-Mcnc     2.9 - 4.4 g/dL 3.4 (C)  Alpha 1     0.0 - 0.4 g/dL 0.4 (C)  Alpha2 Glob SerPl Elph-Mcnc     0.4 - 1.0 g/dL 1.1 (H) (C)  B-Globulin SerPl Elph-Mcnc     0.7 - 1.3 g/dL 1.1 (C)  Gamma Glob SerPl Elph-Mcnc     0.4 - 1.8 g/dL 1.4 (C)  M Protein SerPl Elph-Mcnc     Not Observed g/dL 0.5 (H) (C)  Globulin, Total     2.2 - 3.9 g/dL 3.9 (C)  Albumin/Glob SerPl     0.7 - 1.7  0.9 (C)  IFE 1 Comment ! (C)  Please Note (HCV): Comment (C)  Kappa free light chain     3.3 - 19.4 mg/L 23.6 (H)   Lambda free light chains     5.7 - 26.3 mg/L 9.9   Kappa, lambda light chain ratio     0.26 - 1.65  2.38 (H)    Assessment and plan:   Recurrent papular urticaria (hives) Recurrent papular urticaria most likely due to insect bites.  Will rule-out other causes of urticaria.  - Order blood work for  urticaria.  - Advise double dose of Claritin if rash recurs.  So can take 1 tab twice a day until hives have resolved.   - Continue triamcinolone  cream as needed for itchy rash. - Reserve Benadryl for severe pruritus. - Advise triamcinolone  post-insect bites and consider icing - Recommend Tylenol  for pain if needed.  Allergic rhinitis with multiple environmental allergies Allergic rhinitis with rhinorrhea and drainage, poorly controlled with Flonase . - Prescribe Atrovent nasal spray for runny nose.  Use 2 sprays each nostril up 4 times a day as needed for runny nose.  - Continue Flonase  daily for congestion. - Continue Claritin and Singulair daily  for allergies  Chronic obstructive pulmonary disease (COPD) COPD well-managed with Advair - Continue Advair 1 puff twice daily. - Have access to albuterol inhaler 2 puffs every 4-6 hours as needed for cough/wheeze/shortness of breath/chest tightness.  May use 15-20 minutes prior to activity.   Monitor frequency of use.    Follow-up in 4-6 months or sooner if needed  I appreciate the opportunity to take part in Dejanique's care. Please do not hesitate to contact me with questions.  Sincerely,   Danita Brain, MD Allergy/Immunology [facility]

## 2024-10-17 ENCOUNTER — Ambulatory Visit (HOSPITAL_COMMUNITY)
Admission: RE | Admit: 2024-10-17 | Discharge: 2024-10-17 | Disposition: A | Discharge status: Home / Self Care | Source: Ambulatory Visit | Attending: Cardiology | Admitting: Cardiology

## 2024-10-17 DIAGNOSIS — I272 Pulmonary hypertension, unspecified: Secondary | ICD-10-CM | POA: Insufficient documentation

## 2024-10-17 LAB — ECHOCARDIOGRAM COMPLETE
Area-P 1/2: 4.99 cm2
S' Lateral: 2.9 cm

## 2024-10-21 ENCOUNTER — Ambulatory Visit: Payer: Self-pay | Admitting: Allergy

## 2024-10-21 LAB — ALLERGENS W/TOTAL IGE AREA 2

## 2024-10-21 LAB — CHRONIC URTICARIA (CU) EVAL
ALT: 15 IU/L (ref 0–32)
Basophils Absolute: 0.1 x10E3/uL (ref 0.0–0.2)
Basos: 2 %
CRP: 5 mg/L (ref 0–10)
EOS (ABSOLUTE): 0 x10E3/uL (ref 0.0–0.4)
Eos: 0 %
Hematocrit: 34.1 % (ref 34.0–46.6)
Hemoglobin: 11.5 g/dL (ref 11.1–15.9)
Immature Grans (Abs): 0 x10E3/uL (ref 0.0–0.1)
Immature Granulocytes: 0 %
Lymphocytes Absolute: 1.3 x10E3/uL (ref 0.7–3.1)
Lymphs: 21 %
MCH: 31.9 pg (ref 26.6–33.0)
MCHC: 33.7 g/dL (ref 31.5–35.7)
MCV: 95 fL (ref 79–97)
Monocytes Absolute: 0.4 x10E3/uL (ref 0.1–0.9)
Monocytes: 6 %
Neutrophils Absolute: 4.5 x10E3/uL (ref 1.4–7.0)
Neutrophils: 71 %
Platelets: 203 x10E3/uL (ref 150–450)
Pooled Donor- BAT CU: 5.73 % (ref 0.00–10.60)
RBC: 3.6 x10E6/uL — ABNORMAL LOW (ref 3.77–5.28)
RDW: 13.7 % (ref 11.7–15.4)
Sed Rate: 58 mm/h — ABNORMAL HIGH (ref 0–40)
TSH: 3.12 u[IU]/mL (ref 0.450–4.500)
Thyroperoxidase Ab SerPl-aCnc: 28 [IU]/mL (ref 0–34)
WBC: 6.3 x10E3/uL (ref 3.4–10.8)

## 2024-10-21 LAB — ALPHA-GAL PANEL
Allergen Lamb IgE: <0.1 kU/L
Beef IgE: 0.13 kU/L — AB
IgE (Immunoglobulin E), Serum: 35 [IU]/mL (ref 6–495)
O215-IgE Alpha-Gal: 0.39 kU/L — AB
Pork IgE: <0.1 kU/L

## 2024-10-21 LAB — TRYPTASE: Tryptase: 8.8 ug/L (ref 2.2–13.2)

## 2024-11-02 ENCOUNTER — Ambulatory Visit: Admitting: Physician Assistant

## 2024-11-02 ENCOUNTER — Encounter: Payer: Self-pay | Admitting: Physician Assistant

## 2024-11-02 VITALS — BP 104/66 | HR 81 | Ht 59.5 in | Wt 132.0 lb

## 2024-11-02 DIAGNOSIS — R195 Other fecal abnormalities: Secondary | ICD-10-CM

## 2024-11-02 DIAGNOSIS — K5904 Chronic idiopathic constipation: Secondary | ICD-10-CM | POA: Diagnosis not present

## 2024-11-02 MED ORDER — NA SULFATE-K SULFATE-MG SULF 17.5-3.13-1.6 GM/177ML PO SOLN: 1.0000 | Freq: Once | ORAL | 0 refills | Status: AC

## 2024-11-02 NOTE — Progress Notes (Unsigned)
 Ellouise Console, PA-C [address] [place], [place]  [address] Phone: [phone]   Gastroenterology Consultation  Referring Provider:     Shayne Anes, MD Primary Care Physician:  Shayne Anes, MD Primary Gastroenterologist:  Ellouise Console, PA-C / Glendia Holt, MD  Reason for Consultation:     Heme positive stool, discuss colonoscopy        HPI:   Discussed the use of AI scribe software for clinical note transcription with the patient, who gave verbal consent to proceed. History of Present Illness Debbie Black is a 68 year old female who presents for scheduling a colonoscopy following a positive stool test for blood. She was referred by Dr. Shayne for a colonoscopy due to a positive stool test for blood.  She has never undergone a colonoscopy and has relied on stool sample tests, which have consistently been negative for blood until the recent positive result.  She experiences variability in stool consistency, describing it as sometimes dark, medium, or soft, but not diarrhea. She has not observed any bright red blood in her stool. She experiences significant gas in spells and occasional cramping in the morning, which she attributes to her irregular eating schedule. No abdominal pain, bloating, or significant weight loss, with only minor fluctuations related to her physical activity and diet.  Her past medical history includes anemia during her teenage years and early college.  Routine labs over the past several years have shown normal hemoglobin levels between 12 and 13g.  She denies any family history of colon, esophageal, or stomach cancer. She experiences occasional acid reflux at night but denies heartburn or difficulty swallowing. She is unsure if she has hemorrhoids, as she has not experienced any symptoms typically associated with them.  PMH: Asthma, COPD, hypertension, MGUS.  09/2024 echo LVEF 60 to 65%.  She does not use oxygen.  Past Medical History:  Diagnosis  Date   Asthma    COPD (chronic obstructive pulmonary disease) (HCC)    Hypertension    MGUS (monoclonal gammopathy of unknown significance)    MGUS (monoclonal gammopathy of unknown significance)     Past Surgical History:  Procedure Laterality Date   ADENOIDECTOMY     BONE MARROW BIOPSY     BREAST BIOPSY Right 01/02/2016   BREAST BIOPSY Left 01/26/2024   US  LT BREAST BX W LOC DEV 1ST LESION IMG BX SPEC US  GUIDE 01/26/2024 [facility]   BREAST BIOPSY Left 02/02/2024   MM LT BREAST BX W LOC DEV 1ST LESION IMAGE BX SPEC STEREO GUIDE 02/02/2024 [facility]   left heel surgery     TYMPANOSTOMY TUBE PLACEMENT      Prior to Admission medications   Medication Sig Start Date End Date Taking? Authorizing Provider  acetaminophen  (TYLENOL ) 500 MG tablet Take 500 mg by mouth every 6 (six) hours as needed for headache. Patient not taking: Reported on 10/13/2024    [provider]  albuterol (VENTOLIN HFA) 108 (90 Base) MCG/ACT inhaler 2 puffs every 4-6 hours as needed for cough/wheeze/shortness of breath/chest tightness. 10/13/24   Jeneal Danita Macintosh, MD  escitalopram (LEXAPRO) 10 MG tablet Take 10 mg by mouth daily.    [provider]  fluticasone  (FLONASE ) 50 MCG/ACT nasal spray Place 2 sprays into both nostrils daily.    [provider]  fluticasone -salmeterol (ADVAIR) 100-50 MCG/ACT AEPB Inhale 1 puff into the lungs 2 (two) times daily. 10/13/24   Jeneal Danita Macintosh, MD  ipratropium (ATROVENT) 0.06 %  nasal spray Use 2 sprays each nostril up 4 times a day as needed for runny nose. 10/13/24   Jeneal Danita Macintosh, MD  loratadine (CLARITIN) 10 MG tablet Take 10 mg by mouth daily.    [provider]  MYRBETRIQ 50 MG TB24 tablet Take 50 mg by mouth daily.    [provider]  olmesartan (BENICAR) 20 MG tablet Take 20 mg by mouth daily.    [provider]  omeprazole (PRILOSEC) 20 MG capsule Take 20 mg by mouth  daily. Patient not taking: Reported on 10/13/2024    [provider]  rizatriptan (MAXALT) 10 MG tablet Take 10 mg by mouth as needed for migraine. As needed Patient not taking: Reported on 10/13/2024    [provider]  rosuvastatin (CRESTOR) 5 MG tablet Take 10 mg by mouth daily.    [provider]  SINGULAIR 10 MG tablet Take 5 mg by mouth daily. 05/13/23   [provider]  triamcinolone  cream (KENALOG) 0.1 % Apply 1 Application topically 2 (two) times daily as needed (Itchy rash, insect bites). 10/13/24   Jeneal Danita Macintosh, MD    Family History  Problem Relation Age of Onset   Angioedema Mother    Allergic rhinitis Mother    Eczema Father    Allergic rhinitis Father    Allergic rhinitis Brother    BRCA 1/2 Neg Hx    Breast cancer Neg Hx      Social History   Tobacco Use   Smoking status: Heavy Smoker    Current packs/day: 1.00    Average packs/day: 1 pack/day for 35.0 years (35.0 ttl pk-yrs)    Types: Cigarettes   Smokeless tobacco: Never  Substance Use Topics   Alcohol use: Not Currently    Allergies as of 11/02/2024 - Review Complete 11/02/2024  Allergen Reaction Noted   Adhesive [tape]  04/26/2013   Codeine  04/07/2013   Ibuprofen  Nausea Only 10/13/2024   Latex  04/26/2013   Nsaids Nausea And Vomiting 10/13/2024    Review of Systems:    All systems reviewed and negative except where noted in HPI.   Physical Exam:  BP 104/66   Pulse 81   Ht 4' 11.5 (1.511 m)   Wt 132 lb (59.9 kg)   BMI 26.21 kg/m  No LMP recorded. Patient is postmenopausal.  General:   Alert,  Well-developed, well-nourished, pleasant and cooperative in NAD Lungs:  Respirations even and unlabored.  Clear throughout to auscultation.   No wheezes, crackles, or rhonchi. No acute distress. Heart:  Regular rate and rhythm; no murmurs, clicks, rubs, or gallops. Abdomen:  Normal bowel sounds.  No bruits.  Soft, and non-distended without masses,  hepatosplenomegaly or hernias noted.  No Tenderness.  No guarding or rebound tenderness.    Neurologic:  Alert and oriented x3;  grossly normal neurologically. Psych:  Alert and cooperative. Normal mood and affect.   Imaging Studies: ECHOCARDIOGRAM COMPLETE Result Date: 10/17/2024    ECHOCARDIOGRAM REPORT   Patient Name:   JILLIANN SUBRAMANIAN Date of Exam: 10/17/2024 Medical Rec #:  [id number]     Height:       59.5 in Accession #:    [id number]    Weight:       131.4 lb Date of Birth:  1956/07/28      BSA:          1.552 m Patient Age:    68 years      BP:  150/76 mmHg Patient Gender: F             HR:           90 bpm. Exam Location:  [address] Procedure: 2D Echo, 3D Echo, Cardiac Doppler and Color Doppler (Both Spectral            and Color Flow Doppler were utilized during procedure). Indications:    I27.20 Pulmonary Hypertension  History:        Patient has no prior history of Echocardiogram examinations.  Sonographer:    Waldo Guadalajara RCS Referring Phys: [id number] MARK PERINI IMPRESSIONS  1. Left ventricular ejection fraction, by estimation, is 60 to 65%. Left ventricular ejection fraction by 3D volume is 65 %. The left ventricle has normal function. The left ventricle has no regional wall motion abnormalities. Left ventricular diastolic  parameters are consistent with Grade I diastolic dysfunction (impaired relaxation). Elevated left atrial pressure.  2. Right ventricular systolic function is normal. The right ventricular size is normal. There is normal pulmonary artery systolic pressure. The estimated right ventricular systolic pressure is 13.6 mmHg.  3. The mitral valve is degenerative. No evidence of mitral valve regurgitation. No evidence of mitral stenosis.  4. The aortic valve is tricuspid. Aortic valve regurgitation is not visualized. Aortic valve sclerosis/calcification is present, without any evidence of aortic stenosis.  5. The inferior vena cava is normal in size with greater than 50%  respiratory variability, suggesting right atrial pressure of 3 mmHg. FINDINGS  Left Ventricle: Left ventricular ejection fraction, by estimation, is 60 to 65%. Left ventricular ejection fraction by 3D volume is 65 %. The left ventricle has normal function. The left ventricle has no regional wall motion abnormalities. The left ventricular internal cavity size was normal in size. There is no left ventricular hypertrophy. Left ventricular diastolic parameters are consistent with Grade I diastolic dysfunction (impaired relaxation). Elevated left atrial pressure. Right Ventricle: The right ventricular size is normal. No increase in right ventricular wall thickness. Right ventricular systolic function is normal. There is normal pulmonary artery systolic pressure. The tricuspid regurgitant velocity is 1.63 m/s, and  with an assumed right atrial pressure of 3 mmHg, the estimated right ventricular systolic pressure is 13.6 mmHg. Left Atrium: Left atrial size was normal in size. Right Atrium: Right atrial size was normal in size. Pericardium: There is no evidence of pericardial effusion. Mitral Valve: The mitral valve is degenerative in appearance. There is mild thickening of the mitral valve leaflet(s). There is mild calcification of the mitral valve leaflet(s). Mild to moderate mitral annular calcification. No evidence of mitral valve regurgitation. No evidence of mitral valve stenosis. Tricuspid Valve: The tricuspid valve is normal in structure. Tricuspid valve regurgitation is trivial. No evidence of tricuspid stenosis. Aortic Valve: The aortic valve is tricuspid. Aortic valve regurgitation is not visualized. Aortic valve sclerosis/calcification is present, without any evidence of aortic stenosis. Pulmonic Valve: The pulmonic valve was normal in structure. Pulmonic valve regurgitation is not visualized. No evidence of pulmonic stenosis. Aorta: The aortic root is normal in size and structure. Venous: The inferior vena cava  is normal in size with greater than 50% respiratory variability, suggesting right atrial pressure of 3 mmHg. IAS/Shunts: No atrial level shunt detected by color flow Doppler. Additional Comments: 3D was performed not requiring image post processing on an independent workstation and was normal.  LEFT VENTRICLE PLAX 2D LVIDd:         4.00 cm  Diastology LVIDs:         2.90 cm         LV e' medial:    6.31 cm/s LV PW:         1.00 cm         LV E/e' medial:  13.9 LV IVS:        0.90 cm         LV e' lateral:   5.98 cm/s LVOT diam:     1.60 cm         LV E/e' lateral: 14.6 LV SV:         56 LV SV Index:   36 LVOT Area:     2.01 cm        3D Volume EF LV IVRT:       106 msec        LV 3D EF:    Left                                             ventricul                                             ar                                             ejection                                             fraction                                             by 3D                                             volume is                                             65 %.                                 3D Volume EF:                                3D EF:        65 %                                LV EDV:       72 ml  LV ESV:       25 ml                                LV SV:        46 ml RIGHT VENTRICLE RV Basal diam:  2.70 cm     PULMONARY VEINS RV S prime:     12.80 cm/s  A Reversal Velocity: 37.70 cm/s TAPSE (M-mode): 1.7 cm      Diastolic Velocity:  48.20 cm/s RVSP:           13.6 mmHg   S/D Velocity:        1.70                             Systolic Velocity:   81.20 cm/s LEFT ATRIUM             Index        RIGHT ATRIUM           Index LA diam:        3.10 cm 2.00 cm/m   RA Pressure: 3.00 mmHg LA Vol (A2C):   42.4 ml 27.32 ml/m  RA Area:     7.63 cm LA Vol (A4C):   30.8 ml 19.85 ml/m  RA Volume:   13.00 ml  8.38 ml/m LA Biplane Vol: 36.2 ml 23.33 ml/m  AORTIC VALVE LVOT Vmax:   121.00 cm/s  LVOT Vmean:  86.200 cm/s LVOT VTI:    0.278 m  AORTA Ao Root diam: 2.60 cm Ao Asc diam:  2.50 cm MITRAL VALVE                TRICUSPID VALVE MV Area (PHT):              TR Peak grad:   10.6 mmHg MV Decel Time:              TR Vmax:        163.00 cm/s MV E velocity: 87.50 cm/s   Estimated RAP:  3.00 mmHg MV A velocity: 122.00 cm/s  RVSP:           13.6 mmHg MV E/A ratio:  0.72                             SHUNTS                             Systemic VTI:  0.28 m                             Systemic Diam: 1.60 cm Wilbert Bihari MD Electronically signed by Wilbert Bihari MD Signature Date/Time: 10/17/2024/11:32:10 AM    Final     Labs: CBC    Component Value Date/Time   WBC 6.3 10/13/2024 1431   WBC 6.9 05/20/2024 0821   RBC 3.60 (L) 10/13/2024 1431   RBC 4.16 05/20/2024 0821   HGB 11.5 10/13/2024 1431   HGB 14.1 04/04/2008 0917   HCT 34.1 10/13/2024 1431   HCT 40.8 04/04/2008 0917   PLT 203 10/13/2024 1431   MCV 95 10/13/2024 1431   MCV 93.2 04/04/2008 0917    CMP     Component Value Date/Time   NA 130 (L) 05/20/2024 9178  K 4.1 05/20/2024 0821   CL 96 (L) 05/20/2024 0821   CO2 26 05/20/2024 0821   GLUCOSE 93 05/20/2024 0821   BUN 9 05/20/2024 0821   CREATININE 0.63 05/20/2024 0821   CALCIUM 9.3 05/20/2024 0821   PROT 7.5 05/20/2024 0821   ALBUMIN 4.2 05/20/2024 0821   AST 16 05/20/2024 0821   ALT 15 10/13/2024 1431   ALKPHOS 68 05/20/2024 0821   BILITOT 0.3 05/20/2024 0821   GFRNONAA >60 05/20/2024 0821   GFRAA >60 05/01/2020 1250    Assessment and Plan:   ADAH STONEBERG is a 68 y.o. y/o female has been referred for: Assessment & Plan 1.  Abnormal Positive fecal blood test Positive fecal blood test with no prior colonoscopy. Differential includes hemorrhoids, polyps, and colon cancer.  To be evaluated during colonoscopy. - Scheduling Colonoscopy I discussed risks of colonoscopy with patient to include risk of bleeding, colon perforation, and risk of sedation.  Patient  expressed understanding and agrees to proceed with colonoscopy.   2.  Constipation and variable stool consistency Variable stool consistency with constipation and diarrhea. Possible hemorrhoids to be evaluated during colonoscopy. - Recommended Benefiber for fiber supplementation. - Advised Miralax for constipation management. - Instructed to combine Benefiber and Miralax in the same drink if needed.  Follow up based on colonoscopy results.  Ellouise Console, PA-C

## 2024-11-02 NOTE — Patient Instructions (Addendum)
   Start OTC Benefiber Powder. Mix 1 - 2 Tablespoons in 6 - 8 ounces of a Drink Once Daily. Drink 64 ounces of water / fluids Daily.   For constipation: Start OTC Miralax Powder Mix 1 capful in 6 to 8 ounces of a drink once daily  Recommend high-fiber diet, 30 g of fiber daily Eat fruits, vegetables, and whole grains Drink 64 ounces of water / fluids daily.    You have been scheduled for a Colonoscopy. Please follow written instructions given to you at your visit today.   If you use inhalers (even only as needed), please bring them with you on the day of your procedure.  DO NOT TAKE 7 DAYS PRIOR TO TEST- Trulicity (dulaglutide) Ozempic, Wegovy (semaglutide) Mounjaro (tirzepatide) Bydureon Bcise (exanatide extended release)  DO NOT TAKE 1 DAY PRIOR TO YOUR TEST Rybelsus (semaglutide) Adlyxin (lixisenatide) Victoza (liraglutide) Byetta (exanatide) ___________________________________________________________________________  Please follow up sooner if symptoms increase or worsen   Due to recent changes in healthcare laws, you may see the results of your imaging and laboratory studies on MyChart before your provider has had a chance to review them.  We understand that in some cases there may be results that are confusing or concerning to you. Not all laboratory results come back in the same time frame and the provider may be waiting for multiple results in order to interpret others.  Please give us  48 hours in order for your provider to thoroughly review all the results before contacting the office for clarification of your results.   Thank you for trusting me with your gastrointestinal care!   Ellouise Console, PA-C _______________________________________________________  If your blood pressure at your visit was 140/90 or greater, please contact your primary care physician to follow up on this.  _______________________________________________________  If you are age 42 or older,  your body mass index should be between 23-30. Your Body mass index is 26.21 kg/m. If this is out of the aforementioned range listed, please consider follow up with your Primary Care Provider.  If you are age 27 or younger, your body mass index should be between 19-25. Your Body mass index is 26.21 kg/m. If this is out of the aformentioned range listed, please consider follow up with your Primary Care Provider.   ________________________________________________________  The [facility] providers would like to encourage you to use MYCHART to communicate with providers for non-urgent requests or questions.  Due to long hold times on the telephone, sending your provider a message by [facility] may be a faster and more efficient way to get a response.  Please allow 48 business hours for a response.  Please remember that this is for non-urgent requests.  _______________________________________________________

## 2024-11-03 ENCOUNTER — Encounter: Payer: Self-pay | Admitting: Physician Assistant

## 2024-11-15 NOTE — Progress Notes (Signed)
 Agree with the assessment and plan as outlined by Brigitte Canard, PA-C.

## 2024-12-02 ENCOUNTER — Encounter: Payer: Self-pay | Admitting: Gastroenterology

## 2024-12-09 ENCOUNTER — Encounter: Admitting: Gastroenterology

## 2024-12-16 ENCOUNTER — Encounter: Admitting: Gastroenterology

## 2024-12-27 ENCOUNTER — Telehealth: Payer: Self-pay | Admitting: Physician Assistant

## 2024-12-27 NOTE — Telephone Encounter (Signed)
 Patient called requesting a call to discuss 1/20 colonoscopy. States she has to take probiotics for 30 days. Would like to know if she needs to reschedule. Please advise, thank you

## 2024-12-28 NOTE — Telephone Encounter (Signed)
 Attempted to reach patient. No answer. Voice mail full.

## 2024-12-30 NOTE — Telephone Encounter (Signed)
 2nd attempt to reach patient. No answer, voicemail full.

## 2025-01-02 NOTE — Telephone Encounter (Signed)
 Following 2 attempts to reach patient with no return call and no availability to leave a voicemail, will discontinue efforts to reach patient regarding this issue.

## 2025-01-10 ENCOUNTER — Encounter: Payer: Self-pay | Admitting: Gastroenterology

## 2025-01-17 ENCOUNTER — Encounter: Admitting: Gastroenterology

## 2025-02-20 ENCOUNTER — Encounter: Admitting: Gastroenterology

## 2025-04-13 ENCOUNTER — Ambulatory Visit: Admitting: Allergy

## 2025-05-31 ENCOUNTER — Other Ambulatory Visit

## 2025-06-15 ENCOUNTER — Ambulatory Visit: Admitting: Hematology and Oncology
